# Patient Record
Sex: Female | Born: 1965 | Race: White | Hispanic: No | Marital: Married | Smoking: Never smoker
Health system: Southern US, Community
[De-identification: ages and names within clinical notes are randomized; demographics above are authoritative.]

## PROBLEM LIST (undated history)

## (undated) DIAGNOSIS — S4290XA Fracture of unspecified shoulder girdle, part unspecified, initial encounter for closed fracture: Secondary | ICD-10-CM

## (undated) DIAGNOSIS — J302 Other seasonal allergic rhinitis: Secondary | ICD-10-CM

## (undated) DIAGNOSIS — Z8669 Personal history of other diseases of the nervous system and sense organs: Secondary | ICD-10-CM

## (undated) DIAGNOSIS — I10 Essential (primary) hypertension: Secondary | ICD-10-CM

## (undated) DIAGNOSIS — N809 Endometriosis, unspecified: Secondary | ICD-10-CM

## (undated) HISTORY — DX: Personal history of other diseases of the nervous system and sense organs: Z86.69

## (undated) HISTORY — DX: Essential (primary) hypertension: I10

## (undated) HISTORY — PX: AUGMENTATION MAMMAPLASTY: SUR837

## (undated) HISTORY — DX: Endometriosis, unspecified: N80.9

## (undated) HISTORY — DX: Fracture of unspecified shoulder girdle, part unspecified, initial encounter for closed fracture: S42.90XA

## (undated) HISTORY — DX: Other seasonal allergic rhinitis: J30.2

---

## 1992-04-20 HISTORY — PX: LAPAROTOMY: SHX154

## 1995-04-21 HISTORY — PX: TONSILLECTOMY: SUR1361

## 1997-04-20 HISTORY — PX: TOTAL ABDOMINAL HYSTERECTOMY W/ BILATERAL SALPINGOOPHORECTOMY: SHX83

## 1997-04-20 HISTORY — PX: LAPAROSCOPY: SHX197

## 2004-06-18 HISTORY — PX: BREAST SURGERY: SHX581

## 2007-09-20 ENCOUNTER — Ambulatory Visit: Payer: Self-pay | Admitting: Obstetrics and Gynecology

## 2010-04-21 LAB — HM COLONOSCOPY

## 2010-07-31 LAB — BASIC METABOLIC PANEL
BUN: 13 mg/dL (ref 4–21)
Glucose: 80 mg/dL
Sodium: 138 mmol/L (ref 137–147)

## 2010-09-08 LAB — HM DEXA SCAN

## 2011-09-23 ENCOUNTER — Ambulatory Visit: Payer: Self-pay | Admitting: Obstetrics and Gynecology

## 2011-09-23 LAB — HM MAMMOGRAPHY

## 2012-07-06 ENCOUNTER — Other Ambulatory Visit: Payer: Self-pay | Admitting: *Deleted

## 2012-07-06 ENCOUNTER — Telehealth: Payer: Self-pay | Admitting: *Deleted

## 2012-07-06 MED ORDER — SPIRONOLACTONE 25 MG PO TABS: 25.0000 mg | ORAL_TABLET | Freq: Every day | ORAL | Status: DC

## 2012-07-06 NOTE — Telephone Encounter (Signed)
Patient notified of script that was filled and lab appointment scheduled.

## 2012-07-07 ENCOUNTER — Telehealth: Payer: Self-pay | Admitting: *Deleted

## 2012-07-07 NOTE — Telephone Encounter (Signed)
Called patient to see which pharmacy she wants her rx sent to?

## 2012-07-07 NOTE — Telephone Encounter (Signed)
Send to [facility]

## 2012-07-07 NOTE — Telephone Encounter (Signed)
Rx sent in to pharmacy. 

## 2012-07-15 ENCOUNTER — Telehealth: Payer: Self-pay | Admitting: *Deleted

## 2012-07-15 DIAGNOSIS — I10 Essential (primary) hypertension: Secondary | ICD-10-CM

## 2012-07-15 NOTE — Telephone Encounter (Signed)
I do not know this patient.

## 2012-07-15 NOTE — Telephone Encounter (Signed)
Pt is coming in for labs Monday 03.31.2014 what labs and dx would you like for this pt?  

## 2012-07-16 NOTE — Telephone Encounter (Signed)
Order placed for follow up labs 

## 2012-07-18 ENCOUNTER — Other Ambulatory Visit (INDEPENDENT_AMBULATORY_CARE_PROVIDER_SITE_OTHER): Payer: Managed Care, Other (non HMO)

## 2012-07-18 DIAGNOSIS — I10 Essential (primary) hypertension: Secondary | ICD-10-CM

## 2012-07-18 LAB — BASIC METABOLIC PANEL
BUN: 13 mg/dL (ref 6–23)
CO2: 27 mEq/L (ref 19–32)
Calcium: 9.7 mg/dL (ref 8.4–10.5)
Chloride: 98 mEq/L (ref 96–112)
Creatinine, Ser: 0.8 mg/dL (ref 0.4–1.2)
GFR: 81.01 mL/min (ref >60.00)
Glucose, Bld: 91 mg/dL (ref 70–99)
Potassium: 3.6 mEq/L (ref 3.5–5.1)
Sodium: 135 mEq/L (ref 135–145)

## 2012-07-19 ENCOUNTER — Encounter: Payer: Self-pay | Admitting: Internal Medicine

## 2012-09-16 ENCOUNTER — Encounter: Payer: Self-pay | Admitting: Internal Medicine

## 2012-09-16 ENCOUNTER — Ambulatory Visit (INDEPENDENT_AMBULATORY_CARE_PROVIDER_SITE_OTHER): Payer: Managed Care, Other (non HMO) | Admitting: Internal Medicine

## 2012-09-16 VITALS — BP 120/80 | HR 79 | Temp 98.5°F | Ht 64.0 in | Wt 177.5 lb

## 2012-09-16 DIAGNOSIS — I10 Essential (primary) hypertension: Secondary | ICD-10-CM

## 2012-09-16 DIAGNOSIS — N809 Endometriosis, unspecified: Secondary | ICD-10-CM

## 2012-09-16 DIAGNOSIS — R519 Headache, unspecified: Secondary | ICD-10-CM

## 2012-09-16 DIAGNOSIS — R51 Headache: Secondary | ICD-10-CM

## 2012-09-16 MED ORDER — SPIRONOLACTONE 25 MG PO TABS: 25.0000 mg | ORAL_TABLET | Freq: Every day | ORAL | Status: DC

## 2012-09-16 MED ORDER — AMLODIPINE BESYLATE 10 MG PO TABS: 10.0000 mg | ORAL_TABLET | Freq: Every day | ORAL | Status: DC

## 2012-09-16 NOTE — Patient Instructions (Addendum)
Saline nasal spray - flush nose at least 2-3x/day.  flonase - 2 sprays each nostril in the evening.  mucinex in the am and robitussin in the evening.  I am going to give you a prescription to have only if you need it.

## 2012-09-18 ENCOUNTER — Encounter: Payer: Self-pay | Admitting: Internal Medicine

## 2012-09-18 DIAGNOSIS — N809 Endometriosis, unspecified: Secondary | ICD-10-CM | POA: Insufficient documentation

## 2012-09-18 DIAGNOSIS — I10 Essential (primary) hypertension: Secondary | ICD-10-CM | POA: Insufficient documentation

## 2012-09-18 DIAGNOSIS — R519 Headache, unspecified: Secondary | ICD-10-CM | POA: Insufficient documentation

## 2012-09-18 NOTE — Assessment & Plan Note (Signed)
Followed by GYN.  Just evaluated.  Refer to Dr DeFrancesco's note for details.  Back on Femring now.  On progesterone.  Follow.    

## 2012-09-18 NOTE — Progress Notes (Signed)
Subjective:    Patient ID: Tracy Ward, female    DOB: 1966-01-15, 47 y.o.   MRN: [id number]  HPI 47 year old female with past history of hypertension, seasonal allergies, endometriosis and headaches.  She comes in today to follow up on these issues and to transfer her care here to [facility].  Former patient of mine at [employer].  States that starting approximately one week ago, she had sore throat.  Throat is better.  Now with head congestion.  Ears stopped up.  Some drainage.  No chest tightness or wheezing.  No fever, vomiting or diarrhea.  Some decreased appetite.  Some yellow mucus production.  Has been taking nyquil and dayquil.  No sob.  She recently saw Dr Greggory Keen.  Mora Appl back on [place].  States everything checked out fine.  States stress is decreasing.  Blood pressure has been doing well.     Past Medical History  Diagnosis Date  . Hypertension   . Seasonal allergies   . Endometriosis   . Hx of migraines     Outpatient Encounter Prescriptions as of 09/16/2012  Medication Sig Dispense Refill  . amLODipine (NORVASC) 10 MG tablet Take 1 tablet (10 mg total) by mouth daily.  90 tablet  1  . cetirizine (ZYRTEC) 10 MG tablet Take 10 mg by mouth daily.      . Estradiol Acetate (FEMRING) 0.05 MG/24HR RING Place vaginally. Insert rings into vagina every three months      . fluticasone (FLONASE) 50 MCG/ACT nasal spray Place 2 sprays into the nose daily.      . progesterone (PROMETRIUM) 100 MG capsule Take 1 1/2 capsules by mouth once a day      . rizatriptan (MAXALT) 10 MG tablet Take 10 mg by mouth as needed for migraine. May repeat in 2 hours if needed      . spironolactone (ALDACTONE) 25 MG tablet Take 1 tablet (25 mg total) by mouth daily.  90 tablet  1  . [DISCONTINUED] amLODipine (NORVASC) 10 MG tablet Take 10 mg by mouth daily.      . [DISCONTINUED] spironolactone (ALDACTONE) 25 MG tablet Take 1 tablet (25 mg total) by mouth daily.  90 tablet  0   No facility-administered  encounter medications on file as of 09/16/2012.    Review of Systems Patient denies any headache, lightheadedness or dizziness.  Sinus congestion and pressure as outlined.  Ears stopped up.  Yellow mucus production.  Increased drainage.  No chest tightness or wheezing.   No chest pain or palpitations.  No increased shortness of breath.  Does have cough.   No nausea or vomiting.  No acid reflux.  No abdominal pain or cramping.  No bowel change, such as diarrhea, constipation, BRBPR or melana.  No urine change.  Feels she is handling stress relatively well.  Is better.  States blood pressure has been doing well.  Last check 121/77.        Objective:   Physical Exam Filed Vitals:   09/16/12 1445  BP: 120/80  Pulse: 79  Temp: 98.5 F (36.9 C)   Blood pressure recheck:  118/80, pulse 24  47 year old female in no acute distress.   HEENT:  Nares- slightly erythematous turbinates.  Oropharynx - without lesions.  TMs visualized without erythema.  Minimal tenderness to palpation over the sinuses.   NECK:  Supple.  Nontender.  No audible bruit.  HEART:  Appears to be regular. LUNGS:  No crackles or wheezing audible.  Respirations  even and unlabored.  RADIAL PULSE:  Equal bilaterally.  ABDOMEN:  Soft, nontender.  Bowel sounds present and normal.  No audible abdominal bruit.  EXTREMITIES:  No increased edema present.  DP pulses palpable and equal bilaterally.          Assessment & Plan:  POSSIBLE SINUSITIS.  Symptoms and exam as outlined.  Will treat with mucinex in the am and robitussin in the evening.  Flonase nasal spray and saline nasal spray as directed.  Will given her a prescription for amoxicillin 875mg  to have only if symptoms progress and do not resolve.  Follow.    INCREASED PSYCHOSOCIAL STRESSORS.  Feels she is handling things relatively well.  Decreased stress.  Follow.    HEALTH MAINTENANCE.  Breasts, pelvic and pap smears through gyn.  Reported she did not want mammogram this year.   Will obtain records of last mammogram.

## 2012-09-18 NOTE — Assessment & Plan Note (Signed)
Did not report as an issue for her now.  Follow.   

## 2012-09-18 NOTE — Assessment & Plan Note (Signed)
Blood pressure controlled on current regimen.  Recent metabolic panel wnl.  Follow.

## 2012-10-06 ENCOUNTER — Encounter: Payer: Self-pay | Admitting: Internal Medicine

## 2012-10-25 ENCOUNTER — Other Ambulatory Visit: Payer: Self-pay | Admitting: Internal Medicine

## 2012-11-15 ENCOUNTER — Telehealth: Payer: Self-pay | Admitting: *Deleted

## 2012-11-15 NOTE — Telephone Encounter (Signed)
We are unable to prescribe abx now without seeing the pt.  Her last visit was in May.  She needs evaluation to assess what appropriate treatment warranted.

## 2012-11-15 NOTE — Telephone Encounter (Signed)
Pt states that she has a recurrent sinus infection. She was last seen on 09/16/12 & you had discussed giving her a Abx at that time. I informed pt that she needs to be seen before we could give her an Abx. She wants to know if you would consider calling or sending her in a Abx without her being seen.

## 2012-11-15 NOTE — Telephone Encounter (Signed)
Pt notified & wanted to bo seen today. Informed pt that we have no openings until tomorrow & that she should go to Urgent Care today if she feels that she can't wait until then. Pt verbalized understanding & states that she will probably go to UC today.

## 2013-01-26 ENCOUNTER — Encounter: Payer: Self-pay | Admitting: Cardiovascular Disease

## 2013-03-17 ENCOUNTER — Other Ambulatory Visit: Payer: Self-pay | Admitting: Internal Medicine

## 2013-03-20 ENCOUNTER — Ambulatory Visit: Payer: Managed Care, Other (non HMO) | Admitting: Internal Medicine

## 2013-05-22 ENCOUNTER — Encounter (INDEPENDENT_AMBULATORY_CARE_PROVIDER_SITE_OTHER): Payer: Self-pay

## 2013-05-22 ENCOUNTER — Encounter: Payer: Self-pay | Admitting: Internal Medicine

## 2013-05-22 ENCOUNTER — Ambulatory Visit (INDEPENDENT_AMBULATORY_CARE_PROVIDER_SITE_OTHER): Payer: BC Managed Care – PPO | Admitting: Internal Medicine

## 2013-05-22 VITALS — BP 130/100 | HR 70 | Temp 98.5°F | Ht 64.0 in | Wt 180.5 lb

## 2013-05-22 DIAGNOSIS — R51 Headache: Secondary | ICD-10-CM

## 2013-05-22 DIAGNOSIS — I10 Essential (primary) hypertension: Secondary | ICD-10-CM

## 2013-05-22 DIAGNOSIS — R519 Headache, unspecified: Secondary | ICD-10-CM

## 2013-05-22 DIAGNOSIS — N809 Endometriosis, unspecified: Secondary | ICD-10-CM

## 2013-05-22 MED ORDER — FLUTICASONE PROPIONATE 50 MCG/ACT NA SUSP: 2.0000 | Freq: Every day | NASAL | Status: DC

## 2013-05-22 MED ORDER — SPIRONOLACTONE 25 MG PO TABS: 25.0000 mg | ORAL_TABLET | Freq: Every day | ORAL | Status: DC

## 2013-05-22 MED ORDER — AMLODIPINE BESYLATE 10 MG PO TABS: 10.0000 mg | ORAL_TABLET | Freq: Every day | ORAL | Status: DC

## 2013-05-22 NOTE — Patient Instructions (Signed)
Bilateral screening mammogram.    Tetanus booster or Tdap

## 2013-05-22 NOTE — Progress Notes (Signed)
Pre-visit discussion using our clinic review tool. No additional management support is needed unless otherwise documented below in the visit note.  

## 2013-05-28 ENCOUNTER — Encounter: Payer: Self-pay | Admitting: Internal Medicine

## 2013-05-28 MED ORDER — SPIRONOLACTONE 25 MG PO TABS: 25.0000 mg | ORAL_TABLET | Freq: Every day | ORAL | Status: DC

## 2013-05-28 MED ORDER — AMLODIPINE BESYLATE 10 MG PO TABS: 10.0000 mg | ORAL_TABLET | Freq: Every day | ORAL | Status: DC

## 2013-05-28 MED ORDER — FLUTICASONE PROPIONATE 50 MCG/ACT NA SUSP: 2.0000 | Freq: Every day | NASAL | Status: DC

## 2013-05-28 NOTE — Progress Notes (Signed)
Subjective:    Patient ID: Tracy Ward, female    DOB: 21-Feb-1966, 48 y.o.   MRN: [id number]  HPI 48 year old female with past history of hypertension, seasonal allergies, endometriosis and headaches.  She comes in today for a scheduled follow up.   States she is doing well.  Has been out of her medication for approximately one month.  This was secondary to insurance change.  Seeing Dr Greggory KeeneFrancesco.  Up to date with her gyn exams.  Due f/u 5-6/15.  Handling stress relatively well.  No cardiac symptoms with increased activity or exertion.  No sob.  Bowels stable.     Past Medical History  Diagnosis Date  . Hypertension   . Seasonal allergies   . Endometriosis   . Hx of migraines     Outpatient Encounter Prescriptions as of 05/22/2013  Medication Sig  . amLODipine (NORVASC) 10 MG tablet Take 1 tablet (10 mg total) by mouth daily.  . cetirizine (ZYRTEC) 10 MG tablet Take 10 mg by mouth daily.  . Estradiol Acetate (FEMRING) 0.05 MG/24HR RING Place vaginally. Insert rings into vagina every three months  . fluticasone (FLONASE) 50 MCG/ACT nasal spray Place 2 sprays into both nostrils daily.  . progesterone (PROMETRIUM) 100 MG capsule Take 1 1/2 capsules by mouth once a day  . rizatriptan (MAXALT) 10 MG tablet Take 10 mg by mouth as needed for migraine. May repeat in 2 hours if needed  . spironolactone (ALDACTONE) 25 MG tablet Take 1 tablet (25 mg total) by mouth daily.  . [DISCONTINUED] amLODipine (NORVASC) 10 MG tablet TAKE 1 TABLET BY MOUTH DAILY.  . [DISCONTINUED] fluticasone (FLONASE) 50 MCG/ACT nasal spray SPRAY 2 SPRAY INTO BOTH NOSTRILS ONCE A DAY AS NEEDED FOR ALLERGY SYMPTOMS  . [DISCONTINUED] spironolactone (ALDACTONE) 25 MG tablet Take 1 tablet (25 mg total) by mouth daily.    Review of Systems Patient denies any headache, lightheadedness or dizziness.  Sinus congestion and pressure as outlined.  Ears stopped up.  Yellow mucus production.  Increased drainage.  No chest tightness or  wheezing.   No chest pain or palpitations.  No increased shortness of breath.  Does have cough.   No nausea or vomiting.  No acid reflux.  No abdominal pain or cramping.  No bowel change, such as diarrhea, constipation, BRBPR or melana.  No urine change.  Feels she is handling stress relatively well.  Is better.  States blood pressure has been doing well.  Last check 121/77.        Objective:   Physical Exam  Filed Vitals:   05/22/13 1507  BP: 130/100  Pulse: 70  Temp: 98.5 F (36.9 C)   Blood pressure recheck:  39134/[id number]  48 year old female in no acute distress.   HEENT:  Nares- clear.  Oropharynx - without lesions.    NECK:  Supple.  Nontender.  No audible bruit.  HEART:  Appears to be regular. LUNGS:  No crackles or wheezing audible.  Respirations even and unlabored.  RADIAL PULSE:  Equal bilaterally.  ABDOMEN:  Soft, nontender.  Bowel sounds present and normal.  No audible abdominal bruit.  EXTREMITIES:  No increased edema present.  DP pulses palpable and equal bilaterally.          Assessment & Plan:  INCREASED PSYCHOSOCIAL STRESSORS.  Feels she is handling things relatively well.  Decreased stress.  Follow.    HEALTH MAINTENANCE.  Breasts, pelvic and pap smears through gyn.  Reported she did not want  mammogram last year.  Information given to schedule mammogram.

## 2013-05-28 NOTE — Assessment & Plan Note (Signed)
Did not report as an issue for her now.  Follow.

## 2013-05-28 NOTE — Assessment & Plan Note (Signed)
Followed by GYN.  Just evaluated.  Refer to Dr DeFrancesco's note for details.  Back on Femring now.  On progesterone.  Follow.

## 2013-05-28 NOTE — Assessment & Plan Note (Addendum)
Blood pressure had been under control.  Out of her medication recently.  Blood pressure elevated.  Restart.   Recheck metabolic panel.

## 2013-05-31 ENCOUNTER — Telehealth: Payer: Self-pay | Admitting: *Deleted

## 2013-05-31 DIAGNOSIS — I10 Essential (primary) hypertension: Secondary | ICD-10-CM

## 2013-05-31 DIAGNOSIS — N809 Endometriosis, unspecified: Secondary | ICD-10-CM

## 2013-05-31 DIAGNOSIS — Z13220 Encounter for screening for lipoid disorders: Secondary | ICD-10-CM

## 2013-05-31 NOTE — Telephone Encounter (Signed)
Pt is coming in tomorrow what labs and dx?  

## 2013-05-31 NOTE — Telephone Encounter (Signed)
Orders placed for labs

## 2013-06-01 ENCOUNTER — Other Ambulatory Visit: Payer: BC Managed Care – PPO

## 2013-07-21 ENCOUNTER — Ambulatory Visit: Payer: BC Managed Care – PPO | Admitting: Internal Medicine

## 2013-08-11 ENCOUNTER — Ambulatory Visit: Payer: BC Managed Care – PPO | Admitting: Internal Medicine

## 2013-12-04 ENCOUNTER — Encounter: Payer: Self-pay | Admitting: Internal Medicine

## 2014-04-27 ENCOUNTER — Other Ambulatory Visit: Payer: Self-pay | Admitting: Internal Medicine

## 2014-06-14 ENCOUNTER — Ambulatory Visit (INDEPENDENT_AMBULATORY_CARE_PROVIDER_SITE_OTHER): Payer: BLUE CROSS/BLUE SHIELD | Admitting: Internal Medicine

## 2014-06-14 ENCOUNTER — Encounter: Payer: Self-pay | Admitting: Internal Medicine

## 2014-06-14 VITALS — BP 130/86 | HR 64 | Temp 98.1°F | Ht 64.0 in | Wt 188.4 lb

## 2014-06-14 DIAGNOSIS — Z13220 Encounter for screening for lipoid disorders: Secondary | ICD-10-CM

## 2014-06-14 DIAGNOSIS — Z0000 Encounter for general adult medical examination without abnormal findings: Secondary | ICD-10-CM

## 2014-06-14 DIAGNOSIS — Z8669 Personal history of other diseases of the nervous system and sense organs: Secondary | ICD-10-CM

## 2014-06-14 DIAGNOSIS — M545 Low back pain: Secondary | ICD-10-CM

## 2014-06-14 DIAGNOSIS — R51 Headache: Secondary | ICD-10-CM

## 2014-06-14 DIAGNOSIS — I10 Essential (primary) hypertension: Secondary | ICD-10-CM

## 2014-06-14 DIAGNOSIS — R519 Headache, unspecified: Secondary | ICD-10-CM

## 2014-06-14 DIAGNOSIS — N809 Endometriosis, unspecified: Secondary | ICD-10-CM

## 2014-06-14 LAB — CBC WITH DIFFERENTIAL/PLATELET
Basophils Absolute: 0 10*3/uL (ref 0.0–0.1)
Basophils Relative: 0.4 % (ref 0.0–3.0)
Eosinophils Absolute: 0.3 10*3/uL (ref 0.0–0.7)
Eosinophils Relative: 4.6 % (ref 0.0–5.0)
HCT: 43.7 % (ref 36.0–46.0)
Hemoglobin: 15 g/dL (ref 12.0–15.0)
Lymphocytes Relative: 33.6 % (ref 12.0–46.0)
Lymphs Abs: 2.3 10*3/uL (ref 0.7–4.0)
MCHC: 34.4 g/dL (ref 30.0–36.0)
MCV: 87.2 fl (ref 78.0–100.0)
Monocytes Absolute: 0.6 10*3/uL (ref 0.1–1.0)
Monocytes Relative: 8.9 % (ref 3.0–12.0)
Neutro Abs: 3.6 10*3/uL (ref 1.4–7.7)
Neutrophils Relative %: 52.5 % (ref 43.0–77.0)
Platelets: 302 10*3/uL (ref 150.0–400.0)
RBC: 5.01 Mil/uL (ref 3.87–5.11)
RDW: 12.9 % (ref 11.5–15.5)
WBC: 6.8 10*3/uL (ref 4.0–10.5)

## 2014-06-14 LAB — COMPREHENSIVE METABOLIC PANEL
ALT: 22 U/L (ref 0–35)
AST: 24 U/L (ref 0–37)
Albumin: 4.4 g/dL (ref 3.5–5.2)
Alkaline Phosphatase: 78 U/L (ref 39–117)
BUN: 13 mg/dL (ref 6–23)
CO2: 24 mEq/L (ref 19–32)
Calcium: 9.9 mg/dL (ref 8.4–10.5)
Chloride: 101 mEq/L (ref 96–112)
Creatinine, Ser: 1 mg/dL (ref 0.40–1.20)
GFR: 63 mL/min (ref >60.00)
Glucose, Bld: 94 mg/dL (ref 70–99)
Potassium: 3.8 mEq/L (ref 3.5–5.1)
Sodium: 137 mEq/L (ref 135–145)
Total Bilirubin: 0.3 mg/dL (ref 0.2–1.2)
Total Protein: 7.6 g/dL (ref 6.0–8.3)

## 2014-06-14 LAB — LIPID PANEL
Cholesterol: 198 mg/dL (ref 0–200)
HDL: 49.1 mg/dL (ref >39.00)
LDL Cholesterol: 114 mg/dL — ABNORMAL HIGH (ref 0–99)
NonHDL: 148.9
Total CHOL/HDL Ratio: 4
Triglycerides: 176 mg/dL — ABNORMAL HIGH (ref 0.0–149.0)
VLDL: 35.2 mg/dL (ref 0.0–40.0)

## 2014-06-14 LAB — TSH: TSH: 6.05 u[IU]/mL — ABNORMAL HIGH (ref 0.35–4.50)

## 2014-06-14 MED ORDER — SPIRONOLACTONE 25 MG PO TABS: ORAL_TABLET | ORAL | Status: DC

## 2014-06-14 MED ORDER — TRAMADOL HCL 50 MG PO TABS: 50.0000 mg | ORAL_TABLET | Freq: Two times a day (BID) | ORAL | Status: DC | PRN

## 2014-06-14 MED ORDER — AMLODIPINE BESYLATE 10 MG PO TABS: ORAL_TABLET | ORAL | Status: DC

## 2014-06-14 NOTE — Progress Notes (Signed)
Patient ID: Tracy Ward, female   DOB: 12-10-1965, 49 y.o.   MRN: [id number]   Subjective:    Patient ID: Tracy Ward, female    DOB: 04-04-1966, 49 y.o.   MRN: [id number]  HPI  Patient here for a scheduled follow up.  Has a history of hypertension and endometriosis.  States her blood pressure has been doing well.  Blood pressures have been averaging 125/82.  States up to date with gyn exams.  Has f/u with Dr Greggory Keen in 08/2014.  Gets her mammograms through [facility].  Does report the need to have something intermittently for low back pain.  States injured her back initially years ago.  Will intermittently flare.  No numbness or tingling in her legs.  Pain mostly localized to lower back.  Takes ibuprofen occasionally.  Tylenol does not help.  Has taken tramadol previously and tolerated.  Just needs something to take prn.  Would not take often.  She also reports her eyes have been itching and red.  Worse at times.  Some swelling.  Has tried an oral antihistamine and antihistamine eye drops.  States these did not help.  No cardiac symptoms with increased activity or exertion.  Breathing stable.     Past Medical History  Diagnosis Date  . Hypertension   . Seasonal allergies   . Endometriosis   . Hx of migraines     Current Outpatient Prescriptions on File Prior to Visit  Medication Sig Dispense Refill  . cetirizine (ZYRTEC) 10 MG tablet Take 10 mg by mouth daily.    . Estradiol Acetate (FEMRING) 0.05 MG/24HR RING Place vaginally. Insert rings into vagina every three months    . fluticasone (FLONASE) 50 MCG/ACT nasal spray Place 2 sprays into both nostrils daily. 48 g 1  . progesterone (PROMETRIUM) 100 MG capsule Take 1 1/2 capsules by mouth once a day     No current facility-administered medications on file prior to visit.    Review of Systems  Constitutional: Negative for appetite change and unexpected weight change.  HENT: Negative for congestion and sinus pressure.   Eyes:  Positive for redness and itching. Negative for visual disturbance.       Some swelling around her eye.    Respiratory: Negative for cough, chest tightness and shortness of breath.   Cardiovascular: Negative for chest pain, palpitations and leg swelling.  Gastrointestinal: Negative for nausea, vomiting, abdominal pain and diarrhea.  Genitourinary: Negative for frequency and difficulty urinating.  Musculoskeletal: Positive for back pain. Negative for joint swelling.  Neurological: Negative for dizziness, light-headedness and headaches.  Hematological: Negative for adenopathy. Does not bruise/bleed easily.       Objective:    Physical Exam  Constitutional: She appears well-developed and well-nourished. No distress.  HENT:  Nose: Nose normal.  Mouth/Throat: Oropharynx is clear and moist.  Eyes: Pupils are equal, round, and reactive to light. Right eye exhibits no discharge. Left eye exhibits no discharge.  Some swelling - around the eye.  Minimal redness.   Neck: Neck supple. No thyromegaly present.  Cardiovascular: Normal rate and regular rhythm.   Pulmonary/Chest: Breath sounds normal. No respiratory distress. She has no wheezes.  Abdominal: Soft. Bowel sounds are normal. There is no tenderness.  Musculoskeletal: She exhibits no edema or tenderness.  Lymphadenopathy:    She has no cervical adenopathy.  Skin: No rash noted. No erythema.    BP 130/86 mmHg  Pulse 64  Temp(Src) 98.1 F (36.7 C) (Oral)  Ht  (  1.626 m)  Wt 188 lb 6 oz (85.446 kg)  BMI 32.32 kg/m2  SpO2 94% Wt Readings from Last 3 Encounters:  06/14/14 188 lb 6 oz (85.446 kg)  05/22/13 180 lb 8 oz (81.874 kg)  09/16/12 177 lb 8 oz (80.513 kg)     Lab Results  Component Value Date   WBC 6.8 06/14/2014   HGB 15.0 06/14/2014   HCT 43.7 06/14/2014   PLT 302.0 06/14/2014   GLUCOSE 94 06/14/2014   CHOL 198 06/14/2014   TRIG 176.0* 06/14/2014   HDL 49.10 06/14/2014   LDLCALC 114* 06/14/2014   ALT 22  06/14/2014   AST 24 06/14/2014   NA 137 06/14/2014   K 3.8 06/14/2014   CL 101 06/14/2014   CREATININE 1.00 06/14/2014   BUN 13 06/14/2014   CO2 24 06/14/2014   TSH 6.05* 06/14/2014       Assessment & Plan:   Problem List Items Addressed This Visit    Back pain    Low back pain as outlined.  Intermittent flares.  No radicular symptoms.  Takes antiinflammatory medications prn.  Request to have something else to take prn.  rx given for tramadol.  Has taken previously with no rash or reaction.  Tolerated well.  Follow.  Desires no further w/up at this time.  Follow.        Relevant Medications   traMADol (ULTRAM) tablet 50 mg   Endometriosis    Sees Dr Greggory KeeneFrancesco.  States up to date and due f/u in 08/2014.  Obtain latest records.        Relevant Orders   CBC with Differential/Platelet (Completed)   TSH (Completed)   Headache    Did not report as an issue now.  Follow.       Relevant Medications   amLODIpine (NORVASC) tablet   traMADol (ULTRAM) tablet 50 mg   Health care maintenance    Gets her gyn exam with Dr Greggory KeeneFrancesco.  Up to date.  Obtain records.       History of itching of eye    Persistent intermittent flares.  Has tried antihistamine eye drops and oral antihistamines without relief.  Recommend referral to her eye doctor.  She will call.  Follow.         Hypertension - Primary    Blood pressure recheck 122/84 (today).  Continue same medication regimen.  Check metabolic panel.        Relevant Medications   amLODIpine (NORVASC) tablet   spironolactone (ALDACTONE) tablet   Other Relevant Orders   Comprehensive metabolic panel (Completed)    Other Visit Diagnoses    Screening cholesterol level        Relevant Orders    Lipid panel (Completed)      I spent 25 minutes with the patient and more than 50% of the time was spent in consultation regarding the above.     Dale DurhamSCOTT, Jaideep Pollack, MD

## 2014-06-14 NOTE — Progress Notes (Signed)
Pre visit review using our clinic review tool, if applicable. No additional management support is needed unless otherwise documented below in the visit note. 

## 2014-06-15 ENCOUNTER — Other Ambulatory Visit: Payer: Self-pay | Admitting: Internal Medicine

## 2014-06-15 ENCOUNTER — Encounter: Payer: Self-pay | Admitting: *Deleted

## 2014-06-15 DIAGNOSIS — R7989 Other specified abnormal findings of blood chemistry: Secondary | ICD-10-CM

## 2014-06-15 NOTE — Progress Notes (Signed)
Order placed for f/u tsh.  

## 2014-06-17 ENCOUNTER — Telehealth: Payer: Self-pay | Admitting: Internal Medicine

## 2014-06-17 ENCOUNTER — Encounter: Payer: Self-pay | Admitting: Internal Medicine

## 2014-06-17 DIAGNOSIS — Z8669 Personal history of other diseases of the nervous system and sense organs: Secondary | ICD-10-CM | POA: Insufficient documentation

## 2014-06-17 DIAGNOSIS — Z0000 Encounter for general adult medical examination without abnormal findings: Secondary | ICD-10-CM | POA: Insufficient documentation

## 2014-06-17 DIAGNOSIS — M549 Dorsalgia, unspecified: Secondary | ICD-10-CM | POA: Insufficient documentation

## 2014-06-17 NOTE — Assessment & Plan Note (Signed)
Blood pressure recheck 122/84 (today).  Continue same medication regimen.  Check metabolic panel.

## 2014-06-17 NOTE — Assessment & Plan Note (Signed)
Persistent intermittent flares.  Has tried antihistamine eye drops and oral antihistamines without relief.  Recommend referral to her eye doctor.  She will call.  Follow.

## 2014-06-17 NOTE — Assessment & Plan Note (Signed)
Did not report as an issue now.  Follow.

## 2014-06-17 NOTE — Assessment & Plan Note (Signed)
Sees Dr Greggory KeeneFrancesco.  States up to date and due f/u in 08/2014.  Obtain latest records.

## 2014-06-17 NOTE — Assessment & Plan Note (Signed)
Low back pain as outlined.  Intermittent flares.  No radicular symptoms.  Takes antiinflammatory medications prn.  Request to have something else to take prn.  rx given for tramadol.  Has taken previously with no rash or reaction.  Tolerated well.  Follow.  Desires no further w/up at this time.  Follow.

## 2014-06-17 NOTE — Telephone Encounter (Signed)
Need last couple of office visits and last pap and mammogram - from Dr DeFrancesco's office.

## 2014-06-17 NOTE — Assessment & Plan Note (Signed)
Gets her gyn exam with Dr Greggory KeeneFrancesco.  Up to date.  Obtain records.

## 2014-06-18 ENCOUNTER — Encounter: Payer: Self-pay | Admitting: Internal Medicine

## 2014-06-18 ENCOUNTER — Encounter: Payer: Self-pay | Admitting: *Deleted

## 2014-06-18 NOTE — Telephone Encounter (Signed)
See notes in green folder (Pap was not done in last 2 visits, last mammogram was in 2013-report requested)

## 2014-06-18 NOTE — Telephone Encounter (Signed)
Letter mailed

## 2014-06-18 NOTE — Telephone Encounter (Signed)
Mammogram abstracted 

## 2014-06-18 NOTE — Telephone Encounter (Signed)
Notify pt that she needs mammogram.  Last 2013.  If wants us to schedule we can.  Just let us know.

## 2014-06-18 NOTE — Telephone Encounter (Signed)
Records requested

## 2014-07-03 ENCOUNTER — Telehealth: Payer: Self-pay

## 2014-07-03 NOTE — Telephone Encounter (Signed)
Duplicate

## 2014-07-03 NOTE — Telephone Encounter (Signed)
Pt.notified

## 2014-07-03 NOTE — Telephone Encounter (Signed)
The patient is hoping to get a muscle relaxer called in for her back. She states she is having ongoing discomfort.  [facility] [address]  Pt callback [phone]- [phone]

## 2014-07-03 NOTE — Telephone Encounter (Signed)
I had given her tramadol.  If she is having continued pain and needing something more, needs evaluation.

## 2014-07-11 ENCOUNTER — Telehealth: Payer: Self-pay | Admitting: Internal Medicine

## 2014-07-11 DIAGNOSIS — R7989 Other specified abnormal findings of blood chemistry: Secondary | ICD-10-CM

## 2014-07-11 NOTE — Telephone Encounter (Signed)
The patient wants to have a full thyroid panel done. She has a TSH scheduled on 3.28.16.

## 2014-07-14 NOTE — Telephone Encounter (Signed)
Full thyroid panel ordered

## 2014-07-16 ENCOUNTER — Other Ambulatory Visit: Payer: BLUE CROSS/BLUE SHIELD

## 2014-07-16 ENCOUNTER — Other Ambulatory Visit (INDEPENDENT_AMBULATORY_CARE_PROVIDER_SITE_OTHER): Payer: BLUE CROSS/BLUE SHIELD

## 2014-07-16 DIAGNOSIS — R946 Abnormal results of thyroid function studies: Secondary | ICD-10-CM | POA: Diagnosis not present

## 2014-07-16 DIAGNOSIS — R7989 Other specified abnormal findings of blood chemistry: Secondary | ICD-10-CM

## 2014-07-16 LAB — TSH: TSH: 3.42 u[IU]/mL (ref 0.35–4.50)

## 2014-07-16 LAB — T4, FREE: Free T4: 0.95 ng/dL (ref 0.60–1.60)

## 2014-07-16 LAB — T3, FREE: T3, Free: 3.3 pg/mL (ref 2.3–4.2)

## 2014-07-17 ENCOUNTER — Encounter: Payer: Self-pay | Admitting: *Deleted

## 2014-07-19 ENCOUNTER — Encounter: Payer: Self-pay | Admitting: Internal Medicine

## 2014-07-21 DIAGNOSIS — R638 Other symptoms and signs concerning food and fluid intake: Secondary | ICD-10-CM | POA: Insufficient documentation

## 2014-07-21 DIAGNOSIS — N898 Other specified noninflammatory disorders of vagina: Secondary | ICD-10-CM | POA: Insufficient documentation

## 2014-07-21 DIAGNOSIS — E8940 Asymptomatic postprocedural ovarian failure: Secondary | ICD-10-CM | POA: Insufficient documentation

## 2014-07-21 DIAGNOSIS — R6882 Decreased libido: Secondary | ICD-10-CM | POA: Insufficient documentation

## 2014-07-21 DIAGNOSIS — G8929 Other chronic pain: Secondary | ICD-10-CM | POA: Insufficient documentation

## 2014-07-21 DIAGNOSIS — E039 Hypothyroidism, unspecified: Secondary | ICD-10-CM | POA: Insufficient documentation

## 2014-07-21 DIAGNOSIS — IMO0002 Reserved for concepts with insufficient information to code with codable children: Secondary | ICD-10-CM | POA: Insufficient documentation

## 2014-07-21 DIAGNOSIS — M8580 Other specified disorders of bone density and structure, unspecified site: Secondary | ICD-10-CM | POA: Insufficient documentation

## 2014-07-21 DIAGNOSIS — R102 Pelvic and perineal pain: Secondary | ICD-10-CM

## 2014-07-21 DIAGNOSIS — Z8669 Personal history of other diseases of the nervous system and sense organs: Secondary | ICD-10-CM | POA: Insufficient documentation

## 2014-07-21 DIAGNOSIS — J302 Other seasonal allergic rhinitis: Secondary | ICD-10-CM | POA: Insufficient documentation

## 2014-09-19 ENCOUNTER — Encounter: Payer: Self-pay | Admitting: Obstetrics and Gynecology

## 2014-09-19 ENCOUNTER — Ambulatory Visit (INDEPENDENT_AMBULATORY_CARE_PROVIDER_SITE_OTHER): Payer: BLUE CROSS/BLUE SHIELD | Admitting: Obstetrics and Gynecology

## 2014-09-19 VITALS — BP 130/82 | HR 84 | Ht 64.0 in | Wt 188.1 lb

## 2014-09-19 DIAGNOSIS — N958 Other specified menopausal and perimenopausal disorders: Secondary | ICD-10-CM | POA: Diagnosis not present

## 2014-09-19 DIAGNOSIS — N809 Endometriosis, unspecified: Secondary | ICD-10-CM | POA: Diagnosis not present

## 2014-09-19 DIAGNOSIS — E8940 Asymptomatic postprocedural ovarian failure: Secondary | ICD-10-CM

## 2014-09-19 MED ORDER — PROGESTERONE MICRONIZED 200 MG PO CAPS: 200.0000 mg | ORAL_CAPSULE | Freq: Every day | ORAL | Status: DC

## 2014-09-19 NOTE — Progress Notes (Signed)
Patient ID: Tracy Ward, female   DOB: 04/25/1965, 49 y.o.   MRN: [id number]   GYN ANNUAL PREVENTATIVE CARE ENCOUNTER NOTE  Subjective:       Tracy Ward is a 49 y.o. No obstetric history on file. female here for a routine annual gynecologic exam.  Current complaints: see other note..     Gynecologic History No LMP recorded. Patient has had a hysterectomy. Contraception: status post hysterectomy Last Pap: not needed. Results were: normal Last mammogram: did not go last year- needs one. Results were:   Obstetric History OB History  No data available    Past Medical History  Diagnosis Date  . Hypertension   . Seasonal allergies   . Endometriosis   . Hx of migraines     Past Surgical History  Procedure Laterality Date  . Laparotomy  1994    Endometriosis  . Laparoscopy  1999    Endometriosis  . Tonsillectomy  1997    Partial  . Breast surgery  06/2004    augmentation  . Total abdominal hysterectomy w/ bilateral salpingoophorectomy  1999    Current Outpatient Prescriptions on File Prior to Visit  Medication Sig Dispense Refill  . amLODipine (NORVASC) 10 MG tablet TAKE 1 TABLET (10 MG TOTAL) DAILY 90 tablet 1  . cetirizine (ZYRTEC) 10 MG tablet Take 10 mg by mouth daily.    . fluticasone (FLONASE) 50 MCG/ACT nasal spray Place 2 sprays into both nostrils daily. 48 g 1  . progesterone (PROMETRIUM) 100 MG capsule Take 1 1/2 capsules by mouth once a day    . spironolactone (ALDACTONE) 25 MG tablet TAKE 1 TABLET (25 MG TOTAL) DAILY 90 tablet 1  . azelastine (OPTIVAR) 0.05 % ophthalmic solution 1 drop 2 (two) times daily.  0  . Cetirizine HCl 10 MG CAPS Take 10 mg by mouth as needed.    . Estradiol Acetate (FEMRING) 0.05 MG/24HR RING Place vaginally. Insert rings into vagina every three months    . fluorometholone (FML) 0.1 % ophthalmic suspension Place 1 drop into both eyes 4 (four) times daily.  0  . traMADol (ULTRAM) 50 MG tablet Take 1 tablet (50 mg total) by mouth  every 12 (twelve) hours as needed. (Patient not taking: Reported on 09/19/2014) 30 tablet 0   No current facility-administered medications on file prior to visit.    Allergies  Allergen Reactions  . Celexa [Citalopram]   . Codeine Sulfate Itching  . Imitrex [Sumatriptan]   . Lisinopril Nausea And Vomiting  . Percocet [Oxycodone-Acetaminophen]     History   Social History  . Marital Status: Married    Spouse Name: N/A  . Number of Children: 0  . Years of Education: N/A   Occupational History  .     Social History Main Topics  . Smoking status: Never Smoker   . Smokeless tobacco: Never Used  . Alcohol Use: 0.0 oz/week    0 Standard drinks or equivalent per week  . Drug Use: No  . Sexual Activity: Not on file   Other Topics Concern  . Not on file   Social History Narrative    Family History  Problem Relation Age of Onset  . Arthritis Mother   . Hypertension Father   . Alcohol abuse Father   . Stroke Father   . Arthritis Maternal Grandmother   . Heart disease Maternal Grandfather   . Hypertension Maternal Grandfather   . Cancer Neg Hx   . Colon cancer Neg Hx   .  Ovarian cancer Neg Hx   . Breast cancer Neg Hx     The following portions of the patient's history were reviewed and updated as appropriate: allergies, current medications, past family history, past medical history, past social history, past surgical history and problem list.  Review of Systems ROS   Objective:   BP 130/82 mmHg  Pulse 84  Ht  (1.626 m)  Wt 188 lb 1.6 oz (85.322 kg)  BMI 32.27 kg/m2 CONSTITUTIONAL: Well-developed, well-nourished female in no acute distress.  HENT:  Normocephalic, atraumatic, External right and left ear normal. Oropharynx is clear and moist EYES: Conjunctivae and EOM are normal. Pupils are equal, round, and reactive to light. No scleral icterus.  NECK: Normal range of motion, supple, no masses.  Normal thyroid.  SKIN: Skin is warm and dry. No rash noted. Not  diaphoretic. No erythema. No pallor. NEUROLGIC: Alert and oriented to person, place, and time. Normal reflexes, muscle tone coordination. No cranial nerve deficit noted. PSYCHIATRIC: Normal mood and affect. Normal behavior. Normal judgment and thought content. CARDIOVASCULAR: Normal heart rate noted, regular rhythm RESPIRATORY: Clear to auscultation bilaterally. Effort and breath sounds normal, no problems with respiration noted. BREASTS: Symmetric in size. No masses, skin changes, nipple drainage, or lymphadenopathy. ABDOMEN: Soft, normal bowel sounds, no distention noted.  No tenderness, rebound or guarding.  PELVIC: Normal appearing external genitalia; normal appearing vaginal mucosa and cervix.  Normal appearing discharge.  Pap smear obtained.  Normal uterine size, no other palpable masses, no uterine or adnexal tenderness. MUSCULOSKELETAL: Normal range of motion. No tenderness.  No cyanosis, clubbing, or edema.  2+ distal pulses.   Assessment:   Annual gynecologic examination 49 y.o. Contraception: status post hysterectomy Normal BMI, Overweight, Obesity 1 and Obesity 2 Patient Active Problem List   Diagnosis Date Noted  . Chronic female pelvic pain 07/21/2014  . Decreased libido 07/21/2014  . Coitalgia 07/21/2014  . History of migraine headaches 07/21/2014  . Adult hypothyroidism 07/21/2014  . Increased BMI 07/21/2014  . Osteopenia 07/21/2014  . Allergic rhinitis, seasonal 07/21/2014  . Postsurgical menopause 07/21/2014  . Dryness of vagina 07/21/2014  . Back pain 06/17/2014  . History of itching of eye 06/17/2014  . Health care maintenance 06/17/2014  . Hypertension 09/18/2012  . Endometriosis 09/18/2012  . Headache 09/18/2012     Plan:  Pap: Pap, Reflex if ASCUS, Pap Co Test, GC/CT NAAT, Not needed and Not done Mammogram: ordered, Ordered, Not Ordered and Not Indicated Labs: N/A Routine preventative health maintenance measures emphasized: Return to Clinic - 1  [address] [place], [place]

## 2014-09-19 NOTE — Progress Notes (Signed)
GYN ANNUAL PREVENTATIVE CARE ENCOUNTER NOTE  Subjective:       Tracy Ward is a 49 y.o. No obstetric history on file. female here for a routine annual gynecologic exam.  Current complaints: Patient reports increased discomfort with intercourse.  Symptoms have persisted over the past year and possibly worsened.  She is using the hemorrhaging intravaginal along with micronized progesterone 100 mg at night.  She has concerns about the vaginal absorption of estrogen possibly exacerbating her endometriosis symptoms.   Gynecologic History No LMP recorded. Patient has had a hysterectomy. Contraception: status post hysterectomy Last Pap: 2009. Results were: normal Last mammogram: 2013. Results were: normal  Obstetric History OB History  No data available    Past Medical History  Diagnosis Date  . Hypertension   . Seasonal allergies   . Endometriosis   . Hx of migraines     Past Surgical History  Procedure Laterality Date  . Laparotomy  1994    Endometriosis  . Laparoscopy  1999    Endometriosis  . Tonsillectomy  1997    Partial  . Breast surgery  06/2004    augmentation  . Total abdominal hysterectomy w/ bilateral salpingoophorectomy  1999    Current Outpatient Prescriptions on File Prior to Visit  Medication Sig Dispense Refill  . amLODipine (NORVASC) 10 MG tablet TAKE 1 TABLET (10 MG TOTAL) DAILY 90 tablet 1  . cetirizine (ZYRTEC) 10 MG tablet Take 10 mg by mouth daily.    . fluticasone (FLONASE) 50 MCG/ACT nasal spray Place 2 sprays into both nostrils daily. 48 g 1  . spironolactone (ALDACTONE) 25 MG tablet TAKE 1 TABLET (25 MG TOTAL) DAILY 90 tablet 1  . azelastine (OPTIVAR) 0.05 % ophthalmic solution 1 drop 2 (two) times daily.  0  . fluorometholone (FML) 0.1 % ophthalmic suspension Place 1 drop into both eyes 4 (four) times daily.  0  . traMADol (ULTRAM) 50 MG tablet Take 1 tablet (50 mg total) by mouth every 12 (twelve) hours as needed. (Patient not taking: Reported  on 09/19/2014) 30 tablet 0   No current facility-administered medications on file prior to visit.    Allergies  Allergen Reactions  . Codeine Sulfate Itching  . Imitrex [Sumatriptan]   . Percocet [Oxycodone-Acetaminophen]     History   Social History  . Marital Status: Married    Spouse Name: N/A  . Number of Children: 0  . Years of Education: N/A   Occupational History  .     Social History Main Topics  . Smoking status: Never Smoker   . Smokeless tobacco: Never Used  . Alcohol Use: 0.0 oz/week    0 Standard drinks or equivalent per week  . Drug Use: No  . Sexual Activity: Not on file   Other Topics Concern  . Not on file   Social History Narrative    Family History  Problem Relation Age of Onset  . Arthritis Mother   . Hypertension Father   . Alcohol abuse Father   . Stroke Father   . Arthritis Maternal Grandmother   . Heart disease Maternal Grandfather   . Hypertension Maternal Grandfather   . Cancer Neg Hx   . Colon cancer Neg Hx   . Ovarian cancer Neg Hx   . Breast cancer Neg Hx     The following portions of the patient's history were reviewed and updated as appropriate: allergies, current medications, past family history, past medical history, past social history, past surgical history and  problem list.  Review of Systems ROS. Negative except for HPI positives.   Objective:   BP 130/82 mmHg  Pulse 84  Ht 5\' 4"  (1.626 m)  Wt 188 lb 1.6 oz (85.322 kg)  BMI 32.27 kg/m2 CONSTITUTIONAL: Well-developed, well-nourished female in no acute distress.  HENT:  Normocephalic, atraumatic, External right and left ear normal. Oropharynx is clear and moist EYES: Conjunctivae and EOM are normal.No scleral icterus.  NECK: Normal range of motion, supple, no masses.  Normal thyroid.  SKIN: Skin is warm and dry. No rash noted. Not diaphoretic. No erythema. No pallor. NEUROLGIC: Alert and oriented to person, place, and time. Normal reflexes, muscle tone  coordination. No cranial nerve deficit noted. PSYCHIATRIC: Normal mood and affect. Normal behavior. Normal judgment and thought content. CARDIOVASCULAR: Normal heart rate noted, regular rhythm RESPIRATORY: Clear to auscultation bilaterally. Effort and breath sounds normal, no problems with respiration noted. BREASTS: Symmetric in size. No masses, skin changes, nipple drainage, or lymphadenopathy. Saline implants present. ABDOMEN: Soft, normal bowel sounds, no distention noted.  No tenderness, rebound or guarding. ML incision well healed. No hernias. PELVIC: Normal appearing external genitalia; normal appearing vaginal mucosa and cervix.  Normal appearing discharge.  Pap smear not obtained. Cervix/uterus surgically absent. Bimanual exam with 3/4 cuff tenderness;1 cm nodule noted at lt side of cuff on RV exam. RV: normal sphincter tone; no rectal masses.. MUSCULOSKELETAL: Normal range of motion. No tenderness.  No cyanosis, clubbing, or edema.  2+ distal pulses.   Assessment:   Annual gynecologic examination 49 y.o. Contraception: status post hysterectomy  Overweight Patient Active Problem List   Diagnosis Date Noted  . Chronic female pelvic pain 07/21/2014  . Decreased libido 07/21/2014  . Coitalgia 07/21/2014  . History of migraine headaches 07/21/2014  . Adult hypothyroidism 07/21/2014  . Increased BMI 07/21/2014  . Osteopenia 07/21/2014  . Allergic rhinitis, seasonal 07/21/2014  . Postsurgical menopause 07/21/2014  . Dryness of vagina 07/21/2014  . Back pain 06/17/2014  . History of itching of eye 06/17/2014  . Health care maintenance 06/17/2014  . Hypertension 09/18/2012  . Endometriosis 09/18/2012  . Headache 09/18/2012     Plan:  Pap: Pap, Reflex if ASCUS, Pap Co Test, GC/CT NAAT and Not done Mammogram: Ordered Labs: per Dr. Lorin PicketScott Routine preventative health maintenance measures emphasized: Diet/Weight control and Alcohol/Drug use Plan to discontinue the Femring at this  time.  Micronized progesterone will be increased to 200 mg at night.  Patient will follow-up regarding symptomatology the next 3 months. Return to Clinic - 1 Year   Scheryl MartenMarth Anelisse Jacobson, MD

## 2014-09-20 MED ORDER — PROGESTERONE MICRONIZED 200 MG PO CAPS: 200.0000 mg | ORAL_CAPSULE | Freq: Every day | ORAL | Status: DC

## 2014-09-20 NOTE — Addendum Note (Signed)
Addended by: Marchelle FolksMILLER, Deysha Cartier G on: 09/20/2014 10:50 AM   Modules accepted: Orders

## 2014-11-23 ENCOUNTER — Other Ambulatory Visit: Payer: Self-pay | Admitting: Internal Medicine

## 2014-12-12 ENCOUNTER — Ambulatory Visit: Payer: BLUE CROSS/BLUE SHIELD | Admitting: Internal Medicine

## 2014-12-15 ENCOUNTER — Encounter: Payer: Self-pay | Admitting: Internal Medicine

## 2014-12-17 ENCOUNTER — Other Ambulatory Visit: Payer: Self-pay | Admitting: Surgical

## 2014-12-17 MED ORDER — FLUTICASONE PROPIONATE 50 MCG/ACT NA SUSP: 2.0000 | Freq: Every day | NASAL | Status: DC

## 2014-12-25 ENCOUNTER — Encounter: Payer: Self-pay | Admitting: Obstetrics and Gynecology

## 2014-12-25 ENCOUNTER — Ambulatory Visit (INDEPENDENT_AMBULATORY_CARE_PROVIDER_SITE_OTHER): Payer: BLUE CROSS/BLUE SHIELD | Admitting: Obstetrics and Gynecology

## 2014-12-25 VITALS — BP 118/81 | HR 81 | Ht 65.0 in | Wt 185.1 lb

## 2014-12-25 DIAGNOSIS — N949 Unspecified condition associated with female genital organs and menstrual cycle: Secondary | ICD-10-CM | POA: Diagnosis not present

## 2014-12-25 DIAGNOSIS — N809 Endometriosis, unspecified: Secondary | ICD-10-CM

## 2014-12-25 DIAGNOSIS — G8929 Other chronic pain: Secondary | ICD-10-CM | POA: Diagnosis not present

## 2014-12-25 DIAGNOSIS — R102 Pelvic and perineal pain: Secondary | ICD-10-CM

## 2014-12-25 NOTE — Progress Notes (Signed)
Patient ID: Tracy Ward, female   DOB: 03/09/1966, 49 y.o.   MRN: [id number] 3 month f/u on medication D/c femring prometrium increased 200 qhs Having hot flashes, insomnia  Chief complaint: 1.  Chronic pelvic pain. 2.  History of endometriosis. 3.  Status postTAH/BSO  CONFERENCE NOTE: Patient had exacerbation of chronic pelvic pain while on Femring and Prometrium 100 mg daily.  The Femring was discontinued.  Prometrium was increased to 200 mg daily.  Over the past 3 months, She has not noted any significant change in her symptomatology. Minimal vasomotor symptoms are present.  She has not been engaging in sexual intercourse because of the pain experienced in recent past.  Management options were reviewed.  At this time she wishes to remain off of estrogen replacement therapy and would like to continue with the Prometrium 200 mg daily.  She will return in 3 months for follow-up and further management planning.  Physical exam is deferred today.  PLAN: 1.  Continue with Prometrium 200 mg daily 2.  Monitor symptomatology. 3.  Return in 3 months for follow-up and physical exam for further management planning.  A total of 15 minutes were spent face-to-face with the patient during this encounter and over half of that time dealt with counseling and coordination of care.  Herold Harms, MD

## 2015-02-23 ENCOUNTER — Other Ambulatory Visit: Payer: Self-pay | Admitting: Internal Medicine

## 2015-02-25 NOTE — Telephone Encounter (Signed)
Needs appt

## 2015-03-26 ENCOUNTER — Encounter: Payer: Self-pay | Admitting: Obstetrics and Gynecology

## 2015-03-26 ENCOUNTER — Ambulatory Visit (INDEPENDENT_AMBULATORY_CARE_PROVIDER_SITE_OTHER): Payer: BLUE CROSS/BLUE SHIELD | Admitting: Obstetrics and Gynecology

## 2015-03-26 VITALS — BP 146/84 | HR 90 | Ht 65.0 in | Wt 186.4 lb

## 2015-03-26 DIAGNOSIS — N949 Unspecified condition associated with female genital organs and menstrual cycle: Secondary | ICD-10-CM | POA: Diagnosis not present

## 2015-03-26 DIAGNOSIS — R102 Pelvic and perineal pain: Secondary | ICD-10-CM

## 2015-03-26 DIAGNOSIS — G8929 Other chronic pain: Secondary | ICD-10-CM | POA: Diagnosis not present

## 2015-03-26 DIAGNOSIS — E8940 Asymptomatic postprocedural ovarian failure: Secondary | ICD-10-CM

## 2015-03-26 DIAGNOSIS — N958 Other specified menopausal and perimenopausal disorders: Secondary | ICD-10-CM | POA: Diagnosis not present

## 2015-03-26 DIAGNOSIS — N809 Endometriosis, unspecified: Secondary | ICD-10-CM

## 2015-03-26 MED ORDER — ESTRADIOL 1 MG PO TABS: 1.0000 mg | ORAL_TABLET | Freq: Every day | ORAL | Status: DC

## 2015-03-26 MED ORDER — PROGESTERONE MICRONIZED 200 MG PO CAPS: 200.0000 mg | ORAL_CAPSULE | Freq: Every day | ORAL | Status: DC

## 2015-03-26 NOTE — Patient Instructions (Signed)
1.  Continue Prometrium 200 mg a day. 2.  Estradiol 1 mg daily 3.  Return in 3 months for follow-up

## 2015-03-26 NOTE — Addendum Note (Signed)
Addended by: Sharon SellerEFRANCESCO, MARTIN on: 03/26/2015 08:55 PM   Modules accepted: Level of Service

## 2015-03-26 NOTE — Progress Notes (Signed)
Patient ID: Tracy Ward, female   DOB: [birth year], 49 y.o.   MRN: [id number]   3 month f/u on cpp- endometriosis  Prometrium 200mg  qd Sx- only with IC- slightly better Wants estrogen  Chief complaint: 1.  Chronic pelvic pain. 2.  History of endometriosis. 3.  Status post TAH/BSO.  The patient presents today for 3 month follow-up after switching HRT therapy.  Femring was discontinued 3 months ago.  Prometrium was increased from 100-200 mg daily. Tracy Ward has noted a slight decrease in her pelvic pain but has since developed significant increase in vasomotor symptoms and vaginal dryness.  She would like to return to estrogen replacement therapy in addition to her Prometrium.  Exam in June 2016 demonstrated a tender vaginal cuff with some left-sided nodularity.  This was thought to be due to a flare of endometriosis.  Past medical history, past surgical history, problems, medications, and allergies are reviewed.  OBJECTIVE: BP 146/84 mmHg  Pulse 90  Ht 5\' 5"  (1.651 m)  Wt 186 lb 6.4 oz (84.55 kg)  BMI 31.02 kg/m2 Well-appearing female in no acute distress.  She is alert and oriented. Back: No CVA tenderness. Abdomen: Soft, nontender, without organomegaly; no pelvic masses. Pelvic: External genitalia-normal BUS-normal. Vagina-with fair estrogen effect; vaginal cuff intact. Cervix-surgically absent Uterus-surgically absent. 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  Vaginal cuff tenderness with no evidence of previously identified nodularity. Rectal-normal sphincter tone,; no rectal masses.  ASSESSMENT: 1.  Following discontinuation of estrogen supplementation, there has been a decrease in the endometriosis symptomatology. 2.  Significant vasomotor symptoms and vaginal dryness symptoms are unacceptable.  PLAN: 1.  Continue with Prometrium 200 mg a day. 2.  Add estradiol 1 mg by mouth daily 3.  Monitor symptomatology and return in 3 months for  follow-up.  Prentice DockerMartin A Criss Pallone, MD  A total of 25 minutes were spent face-to-face with the patient during this encounter and over half of that time involved counseling and coordination of care.  Note: This dictation was prepared with Dragon dictation along with smaller phrase technology. Any transcriptional errors that result from this process are unintentional.

## 2015-05-24 ENCOUNTER — Other Ambulatory Visit: Payer: Self-pay | Admitting: Internal Medicine

## 2015-05-24 NOTE — Telephone Encounter (Signed)
Norvasc and Aldactone filled

## 2015-06-25 ENCOUNTER — Ambulatory Visit: Payer: BLUE CROSS/BLUE SHIELD | Admitting: Obstetrics and Gynecology

## 2015-09-25 ENCOUNTER — Encounter: Payer: BLUE CROSS/BLUE SHIELD | Admitting: Obstetrics and Gynecology

## 2016-01-13 ENCOUNTER — Other Ambulatory Visit: Payer: Self-pay | Admitting: Internal Medicine

## 2016-04-09 ENCOUNTER — Other Ambulatory Visit: Payer: Self-pay | Admitting: Obstetrics and Gynecology

## 2016-04-15 ENCOUNTER — Other Ambulatory Visit: Payer: Self-pay

## 2016-04-22 ENCOUNTER — Other Ambulatory Visit: Payer: Self-pay | Admitting: Obstetrics and Gynecology

## 2016-04-23 ENCOUNTER — Telehealth: Payer: Self-pay | Admitting: Obstetrics and Gynecology

## 2016-04-23 MED ORDER — ESTRADIOL 1 MG PO TABS: 1.0000 mg | ORAL_TABLET | Freq: Every day | ORAL | 0 refills | Status: DC

## 2016-04-23 MED ORDER — PROGESTERONE MICRONIZED 200 MG PO CAPS: 200.0000 mg | ORAL_CAPSULE | Freq: Every day | ORAL | 0 refills | Status: DC

## 2016-04-23 NOTE — Telephone Encounter (Signed)
Pt aware she needs an appt. Appt made for 05/13/16- made for f/u endometriosis d/t pt not having any insurance at this time. 30 day supply of estradiol and Prometrium erx.

## 2016-04-23 NOTE — Telephone Encounter (Signed)
lmtrc

## 2016-04-23 NOTE — Telephone Encounter (Signed)
Thi spt said [facility] told her they have several request for estradiol and progesterone and we have not sent it back.

## 2016-05-11 ENCOUNTER — Telehealth: Payer: Self-pay | Admitting: Obstetrics and Gynecology

## 2016-05-11 MED ORDER — ESTRADIOL 1 MG PO TABS: 1.0000 mg | ORAL_TABLET | Freq: Every day | ORAL | 0 refills | Status: DC

## 2016-05-11 MED ORDER — PROGESTERONE MICRONIZED 200 MG PO CAPS: 200.0000 mg | ORAL_CAPSULE | Freq: Every day | ORAL | 0 refills | Status: DC

## 2016-05-11 NOTE — Telephone Encounter (Signed)
PT NEEDS ESTRODIAL AND PROGESTON FILLED. I HAD TO MOVE HER APPT OUT B/C DR DE IN [place]

## 2016-05-11 NOTE — Telephone Encounter (Signed)
Pt aware per vm meds erx.

## 2016-05-13 ENCOUNTER — Encounter: Payer: BLUE CROSS/BLUE SHIELD | Admitting: Obstetrics and Gynecology

## 2016-05-27 ENCOUNTER — Ambulatory Visit (INDEPENDENT_AMBULATORY_CARE_PROVIDER_SITE_OTHER): Payer: Self-pay | Admitting: Obstetrics and Gynecology

## 2016-05-27 ENCOUNTER — Encounter: Payer: Self-pay | Admitting: Obstetrics and Gynecology

## 2016-05-27 VITALS — BP 147/84 | HR 84 | Ht 65.0 in | Wt 185.7 lb

## 2016-05-27 DIAGNOSIS — N809 Endometriosis, unspecified: Secondary | ICD-10-CM

## 2016-05-27 DIAGNOSIS — R102 Pelvic and perineal pain: Secondary | ICD-10-CM

## 2016-05-27 DIAGNOSIS — G8929 Other chronic pain: Secondary | ICD-10-CM

## 2016-05-27 MED ORDER — ESTRADIOL 1 MG PO TABS: 1.0000 mg | ORAL_TABLET | Freq: Every day | ORAL | 0 refills | Status: DC

## 2016-05-27 MED ORDER — PROGESTERONE MICRONIZED 200 MG PO CAPS
200.0000 mg | ORAL_CAPSULE | Freq: Every day | ORAL | 0 refills | Status: DC
Start: 2016-05-27 — End: 2016-06-02

## 2016-05-27 NOTE — Patient Instructions (Signed)
1. Continue estradiol 1 mg a day 2. Continue Prometrium 200 mg a day 3. Call back to schedule annual exam within the year

## 2016-05-28 NOTE — Progress Notes (Signed)
Chief complaint: 1. Surgical menopause 2. Symptomatic endometriosis with history of dyspareunia  Patient presents for follow-up on HRT therapy. 3 months ago estradiol 1 mg a day was added to her Prometrium 200 mg a day. She is status post TAH/BSO due to stage IV endometriosis. She is status post [facility] greater than 15 years ago. Her primary complaints prior to starting the estradiol was dyspareunia. Since going on the additional estradiol, she has noted improvement in her symptomatology. She is pleased with current regimen and wishes no changes.  Past medical history, past surgical history, problem list, medications, and allergies are reviewed  OBJECTIVE: BP (!) 147/84   Pulse 84   Ht 5\' 5"  (1.651 m)   Wt 185 lb 11.2 oz (84.2 kg)   BMI 30.90 kg/m  Physical exam-deferred  ASSESSMENT: 1. Surgical menopause, with improvement in symptomatology with addition of estradiol 1 mg daily 2. Decrease in severity of dyspareunia 3. History of endometriosis, status post TAH/BSO  PLAN: 1. Continue with estradiol 1 mg a day 2. Continue with Prometrium 200 mg daily 3. Return later this year for annual exam  A total of 15 minutes were spent face-to-face with the patient during this encounter and over half of that time dealt with counseling and coordination of care.  Herold HarmsMartin A Terrisa Curfman, MD  Note: This dictation was prepared with Dragon dictation along with smaller phrase technology. Any transcriptional errors that result from this process are unintentional.

## 2016-06-02 ENCOUNTER — Telehealth: Payer: Self-pay | Admitting: Obstetrics and Gynecology

## 2016-06-02 MED ORDER — ESTRADIOL 1 MG PO TABS: 1.0000 mg | ORAL_TABLET | Freq: Every day | ORAL | 0 refills | Status: DC

## 2016-06-02 MED ORDER — PROGESTERONE MICRONIZED 200 MG PO CAPS: 200.0000 mg | ORAL_CAPSULE | Freq: Every day | ORAL | 0 refills | Status: DC

## 2016-06-02 NOTE — Addendum Note (Signed)
Addended by: Marchelle FolksMILLER, Hedwig Mcfall G on: 06/02/2016 04:22 PM   Modules accepted: Orders

## 2016-06-02 NOTE — Telephone Encounter (Signed)
Pt aware med was confirmed by pharmacy on 2/7 at 1:35. Med resent. Pt will contact office if she is unable to get meds.

## 2016-06-02 NOTE — Telephone Encounter (Signed)
Pt said her medication estradiol, progesterone was not sent to pharmacy 90 day supply ([facility] [address])

## 2016-08-20 ENCOUNTER — Ambulatory Visit (INDEPENDENT_AMBULATORY_CARE_PROVIDER_SITE_OTHER): Payer: Self-pay | Admitting: Family Medicine

## 2016-08-20 ENCOUNTER — Encounter: Payer: Self-pay | Admitting: Family Medicine

## 2016-08-20 ENCOUNTER — Other Ambulatory Visit: Payer: Self-pay | Admitting: Internal Medicine

## 2016-08-20 DIAGNOSIS — B029 Zoster without complications: Secondary | ICD-10-CM

## 2016-08-20 MED ORDER — VALACYCLOVIR HCL 1 G PO TABS: 1000.0000 mg | ORAL_TABLET | Freq: Three times a day (TID) | ORAL | 0 refills | Status: DC

## 2016-08-20 NOTE — Progress Notes (Signed)
   Subjective:  Patient ID: Tracy Ward, female    DOB: [birth year]  Age: 51 y.o. MRN: [id number]  CC: Rash  HPI:  51 year old female presents with complaints of rash.  Patient states that it started on Monday. Started on the back, left side. She has some rash underneath the left breast. She states it is quite painful and sensitive. Moderate in severity. No new exposures. No known insect bite. No known exacerbating or relieving factors. No other associated symptoms. No other complaints or concerns at this time.  Social Hx   Social History   Social History  . Marital status: Married    Spouse name: N/A  . Number of children: 0  . Years of education: N/A   Occupational History  .  [employer]   Social History Main Topics  . Smoking status: Never Smoker  . Smokeless tobacco: Never Used  . Alcohol use 0.0 oz/week  . Drug use: No  . Sexual activity: Yes    Birth control/ protection: Surgical   Other Topics Concern  . None   Social History Narrative  . None    Review of Systems  Constitutional: Negative.   Skin: Positive for rash.   Objective:  BP (!) 148/88   Pulse 81   Temp 98.1 F (36.7 C) (Oral)   Wt 184 lb 2 oz (83.5 kg)   SpO2 97%   BMI 30.64 kg/m   BP/Weight 08/20/2016 05/27/2016 03/26/2015  Systolic BP 148 147 146  Diastolic BP 88 84 84  Wt. (Lbs) 184.13 185.7 186.4  BMI 30.64 30.9 31.02    Physical Exam  Constitutional: She is oriented to person, place, and time. She appears well-developed. No distress.  Pulmonary/Chest: Effort normal.  Neurological: She is alert and oriented to person, place, and time.  Skin:  L thoracic region - patchy of vesicles with surrounding erythema. Tender to palpation. There is a similarly appearing area underneath the left breast.  Psychiatric: She has a normal mood and affect.  Vitals reviewed.   Lab Results  Component Value Date   WBC 6.8 06/14/2014   HGB 15.0 06/14/2014   HCT 43.7 06/14/2014   PLT  302.0 06/14/2014   GLUCOSE 94 06/14/2014   CHOL 198 06/14/2014   TRIG 176.0 (H) 06/14/2014   HDL 49.10 06/14/2014   LDLCALC 114 (H) 06/14/2014   ALT 22 06/14/2014   AST 24 06/14/2014   NA 137 06/14/2014   K 3.8 06/14/2014   CL 101 06/14/2014   CREATININE 1.00 06/14/2014   BUN 13 06/14/2014   CO2 24 06/14/2014   TSH 3.42 07/16/2014    Assessment & Plan:   Problem List Items Addressed This Visit    Herpes zoster    New problem. Treating with Valtrex.       Relevant Medications   valACYclovir (VALTREX) 1000 MG tablet      Meds ordered this encounter  Medications  . valACYclovir (VALTREX) 1000 MG tablet    Sig: Take 1 tablet (1,000 mg total) by mouth 3 (three) times daily.    Dispense:  21 tablet    Refill:  0   Follow-up: PRN  Everlene OtherJayce Benjamine Strout DO [facility] [place]

## 2016-08-20 NOTE — Progress Notes (Signed)
Pre visit review using our clinic review tool, if applicable. No additional management support is needed unless otherwise documented below in the visit note. 

## 2016-08-20 NOTE — Patient Instructions (Signed)
Shingles Shingles, which is also known as herpes zoster, is an infection that causes a painful skin rash and fluid-filled blisters. Shingles is not related to genital herpes, which is a sexually transmitted infection. Shingles only develops in people who:  Have had chickenpox.  Have received the chickenpox vaccine. (This is rare.)  What are the causes? Shingles is caused by varicella-zoster virus (VZV). This is the same virus that causes chickenpox. After exposure to VZV, the virus stays in the body in an inactive (dormant) state. Shingles develops if the virus reactivates. This can happen many years after the initial exposure to VZV. It is not known what causes this virus to reactivate. What increases the risk? People who have had chickenpox or received the chickenpox vaccine are at risk for shingles. Infection is more common in people who:  Are older than age 50.  Have a weakened defense (immune) system, such as those with HIV, AIDS, or cancer.  Are taking medicines that weaken the immune system, such as transplant medicines.  Are under great stress.  What are the signs or symptoms? Early symptoms of this condition include itching, tingling, and pain in an area on your skin. Pain may be described as burning, stabbing, or throbbing. A few days or weeks after symptoms start, a painful red rash appears, usually on one side of the body in a bandlike or beltlike pattern. The rash eventually turns into fluid-filled blisters that break open, scab over, and dry up in about 2-3 weeks. At any time during the infection, you may also develop:  A fever.  Chills.  A headache.  An upset stomach.  How is this diagnosed? This condition is diagnosed with a skin exam. Sometimes, skin or fluid samples are taken from the blisters before a diagnosis is made. These samples are examined under a microscope or sent to a lab for testing. How is this treated? There is no specific cure for this condition.  Your health care provider will probably prescribe medicines to help you manage pain, recover more quickly, and avoid long-term problems. Medicines may include:  Antiviral drugs.  Anti-inflammatory drugs.  Pain medicines.  If the area involved is on your face, you may be referred to a specialist, such as an eye doctor (ophthalmologist) or an ear, nose, and throat (ENT) doctor to help you avoid eye problems, chronic pain, or disability. Follow these instructions at home: Medicines  Take medicines only as directed by your health care provider.  Apply an anti-itch or numbing cream to the affected area as directed by your health care provider. Blister and Rash Care  Take a cool bath or apply cool compresses to the area of the rash or blisters as directed by your health care provider. This may help with pain and itching.  Keep your rash covered with a loose bandage (dressing). Wear loose-fitting clothing to help ease the pain of material rubbing against the rash.  Keep your rash and blisters clean with mild soap and cool water or as directed by your health care provider.  Check your rash every day for signs of infection. These include redness, swelling, and pain that lasts or increases.  Do not pick your blisters.  Do not scratch your rash. General instructions  Rest as directed by your health care provider.  Keep all follow-up visits as directed by your health care provider. This is important.  Until your blisters scab over, your infection can cause chickenpox in people who have never had it or been vaccinated   against it. To prevent this from happening, avoid contact with other people, especially: ? Babies. ? Pregnant women. ? Children who have eczema. ? Elderly people who have transplants. ? People who have chronic illnesses, such as leukemia or AIDS. Contact a health care provider if:  Your pain is not relieved with prescribed medicines.  Your pain does not get better after  the rash heals.  Your rash looks infected. Signs of infection include redness, swelling, and pain that lasts or increases. Get help right away if:  The rash is on your face or nose.  You have facial pain, pain around your eye area, or loss of feeling on one side of your face.  You have ear pain or you have ringing in your ear.  You have loss of taste.  Your condition gets worse. This information is not intended to replace advice given to you by your health care provider. Make sure you discuss any questions you have with your health care provider. Document Released: 04/06/2005 Document Revised: 12/01/2015 Document Reviewed: 02/15/2014 Elsevier Interactive Patient Education  2017 Elsevier Inc.  

## 2016-08-20 NOTE — Assessment & Plan Note (Signed)
New problem. Treating with Valtrex.

## 2016-08-20 NOTE — Telephone Encounter (Signed)
Last OV was in 2016 with you.  Had an acute visit with Dr. Adriana Simasook today, Please advise for refill, thanks

## 2016-08-21 NOTE — Telephone Encounter (Signed)
She has not had labs or visit with me since 2016.  Needs labs and appt.

## 2016-08-23 ENCOUNTER — Other Ambulatory Visit: Payer: Self-pay | Admitting: Internal Medicine

## 2016-08-31 ENCOUNTER — Telehealth: Payer: Self-pay | Admitting: Internal Medicine

## 2016-08-31 ENCOUNTER — Other Ambulatory Visit: Payer: Self-pay | Admitting: Family Medicine

## 2016-08-31 MED ORDER — GABAPENTIN 300 MG PO CAPS: 300.0000 mg | ORAL_CAPSULE | Freq: Three times a day (TID) | ORAL | 3 refills | Status: DC

## 2016-08-31 NOTE — Telephone Encounter (Signed)
Spoke with patient she states neurontin is working for her.   Would like script called in for her.

## 2016-08-31 NOTE — Telephone Encounter (Signed)
If the gabapentin is working, I am happy to continue. Do I need to send this in? Follow up with PCP.

## 2016-08-31 NOTE — Telephone Encounter (Signed)
Pt called and stated that she was seen 10 days ago for Shingles. Dr. Adriana Simasook informed pt at that time that if it could not be managed that we may need to call in medication like gabapentin. Pt states that the shingles have increasingly got worse. Pt would like to know if we could call in an rx. Please advise, thanks. Please advise, thank you!  Call pt @ [phone]

## 2016-08-31 NOTE — Telephone Encounter (Signed)
Spoke with patient states seen for shingles 10 days ago. Pain has gotten increasingly worse.   Had script for gabapentin 300 mg took 1 pill   Tid then 2 pills in q hs to  help relieve the pain.   She states this seems to be working.  Please advise.

## 2016-08-31 NOTE — Telephone Encounter (Signed)
Rx sent 

## 2016-08-31 NOTE — Telephone Encounter (Signed)
Patient advised and verbalized understanding 

## 2016-11-26 ENCOUNTER — Other Ambulatory Visit: Payer: Self-pay | Admitting: Obstetrics and Gynecology

## 2017-02-21 ENCOUNTER — Other Ambulatory Visit: Payer: Self-pay | Admitting: Internal Medicine

## 2017-02-21 ENCOUNTER — Other Ambulatory Visit: Payer: Self-pay | Admitting: Obstetrics and Gynecology

## 2017-02-22 NOTE — Telephone Encounter (Signed)
Patient has not seen PCP since 2016 Saw Dr. Adriana Simasook 08/31/16

## 2017-02-22 NOTE — Telephone Encounter (Signed)
Please let her know that I have not seen her since 2016 and am unable to continue to refill without seeing her.  Is she seeing someone else?  If so, then pharmacy needs to forward refill request to that provider.  If she is not seeing someone else, she needs labs asap and also needs to be scheduled an appt and then can refill until appt only if labs are scheduled (and done).

## 2017-02-24 ENCOUNTER — Other Ambulatory Visit: Payer: Self-pay | Admitting: Internal Medicine

## 2017-02-24 DIAGNOSIS — I10 Essential (primary) hypertension: Secondary | ICD-10-CM

## 2017-02-24 NOTE — Telephone Encounter (Signed)
Patient currently has no Insurance and has to go out of the [place], in the next month sh e has enough medication to last about two weeks she is willing to schedule the minimum labs needed and have those done and then scheule appointment with provider when she return if she can get a 30 day supply of medication.

## 2017-02-24 NOTE — Telephone Encounter (Signed)
If she wants just the minimum - given her medication, I would like to at least check met b.  (we can check her cholesterol, thyroid and other blood counts when she is able).  I would at least like to get the met b.  This can be non fasting.  Please schedule and then ok to refill medications .   I have placed the order for the met b.  Thanks

## 2017-02-24 NOTE — Progress Notes (Signed)
Order placed for met b 

## 2017-02-25 NOTE — Telephone Encounter (Signed)
See attached.  Need to schedule met b and then ok to refill medication x 1.

## 2017-05-11 ENCOUNTER — Encounter: Payer: Self-pay | Admitting: Internal Medicine

## 2017-05-11 ENCOUNTER — Ambulatory Visit (INDEPENDENT_AMBULATORY_CARE_PROVIDER_SITE_OTHER): Payer: Self-pay | Admitting: Internal Medicine

## 2017-05-11 VITALS — BP 142/86 | HR 80 | Temp 98.5°F | Resp 18 | Wt 180.6 lb

## 2017-05-11 DIAGNOSIS — Z1239 Encounter for other screening for malignant neoplasm of breast: Secondary | ICD-10-CM

## 2017-05-11 DIAGNOSIS — Z1231 Encounter for screening mammogram for malignant neoplasm of breast: Secondary | ICD-10-CM

## 2017-05-11 DIAGNOSIS — N809 Endometriosis, unspecified: Secondary | ICD-10-CM

## 2017-05-11 DIAGNOSIS — I10 Essential (primary) hypertension: Secondary | ICD-10-CM

## 2017-05-11 MED ORDER — AMLODIPINE BESYLATE 10 MG PO TABS: 10.0000 mg | ORAL_TABLET | Freq: Every day | ORAL | 0 refills | Status: DC

## 2017-05-11 MED ORDER — SPIRONOLACTONE 25 MG PO TABS: 25.0000 mg | ORAL_TABLET | Freq: Every day | ORAL | 0 refills | Status: DC

## 2017-05-11 NOTE — Progress Notes (Signed)
Patient ID: Tracy Ward, female   DOB: [birth year], 52 y.o.   MRN: [id number]   Subjective:    Patient ID: Tracy Ward, female    DOB: 1965/09/09, 52 y.o.   MRN: [id number]  HPI  Patient here for a scheduled follow up.  I have not seen her since 08/2014.  Was instructed to come in for evaluation.  Has been seeing Dr Odis Luster for shoulder fracture.  Has f/u in two weeks.  PT.  Overall she feels she is doing relatively well.  Staying active.  No chest pain.  No sob.  No acid reflux.  No abdominal pain.  Bowels moving. She has been off amlodipine.  States ran out of medication.  Reports only taking spironolactone now.  States blood pressure has been averaging 120-130/80s.  Discussed importance of mammogram and regular follow up appts.  Information given regarding [facility] - for scheduling mammogram.    Past Medical History:  Diagnosis Date  . Endometriosis   . Hx of migraines   . Hypertension   . Seasonal allergies    Past Surgical History:  Procedure Laterality Date  . BREAST SURGERY  06/2004   augmentation  . LAPAROSCOPY  1999   Endometriosis  . LAPAROTOMY  1994   Endometriosis  . TONSILLECTOMY  1997   Partial  . TOTAL ABDOMINAL HYSTERECTOMY W/ BILATERAL SALPINGOOPHORECTOMY  1999   Family History  Problem Relation Age of Onset  . Arthritis Mother   . Hypertension Father   . Alcohol abuse Father   . Stroke Father   . Arthritis Maternal Grandmother   . Heart disease Maternal Grandfather   . Hypertension Maternal Grandfather   . Cancer Neg Hx   . Colon cancer Neg Hx   . Ovarian cancer Neg Hx   . Breast cancer Neg Hx    Social History   Socioeconomic History  . Marital status: Married    Spouse name: None  . Number of children: 0  . Years of education: None  . Highest education level: None  Social Needs  . Financial resource strain: None  . Food insecurity - worry: None  . Food insecurity - inability: None  . Transportation needs - medical: None  .  Transportation needs - non-medical: None  Occupational History    Employer: [employer]  Tobacco Use  . Smoking status: Never Smoker  . Smokeless tobacco: Never Used  Substance and Sexual Activity  . Alcohol use: Yes    Alcohol/week: 0.0 oz  . Drug use: No  . Sexual activity: Yes    Birth control/protection: Surgical  Other Topics Concern  . None  Social History Narrative  . None    Outpatient Encounter Medications as of 05/11/2017  Medication Sig  . amLODipine (NORVASC) 10 MG tablet Take 1 tablet (10 mg total) by mouth daily.  Marland Kitchen estradiol (ESTRACE) 1 MG tablet Take 1 tablet (1 mg total) by mouth daily.  . progesterone (PROMETRIUM) 200 MG capsule Take 1 capsule (200 mg total) by mouth at bedtime.  Marland Kitchen spironolactone (ALDACTONE) 25 MG tablet Take 1 tablet (25 mg total) by mouth daily.  . [DISCONTINUED] amLODipine (NORVASC) 10 MG tablet TAKE 1 TABLET BY MOUTH ONCE DAILY  . [DISCONTINUED] spironolactone (ALDACTONE) 25 MG tablet TAKE 1 TABLET BY MOUTH ONCE DAILY  . cetirizine (ZYRTEC) 10 MG tablet Take 10 mg by mouth daily.  . fluticasone (FLONASE) 50 MCG/ACT nasal spray Place 2 sprays into both nostrils daily. (Patient not taking: Reported on  05/11/2017)  . gabapentin (NEURONTIN) 300 MG capsule Take 1 capsule (300 mg total) by mouth 3 (three) times daily. (Patient not taking: Reported on 05/11/2017)  . valACYclovir (VALTREX) 1000 MG tablet Take 1 tablet (1,000 mg total) by mouth 3 (three) times daily. (Patient not taking: Reported on 05/11/2017)  . [DISCONTINUED] estradiol (ESTRACE) 1 MG tablet TAKE 1 TABLET BY MOUTH ONCE DAILY  . [DISCONTINUED] progesterone (PROMETRIUM) 200 MG capsule TAKE 1 CAPSULE BY MOUTH AT BEDTIME   No facility-administered encounter medications on file as of 05/11/2017.     Review of Systems  Constitutional: Negative for appetite change and unexpected weight change.  HENT: Negative for congestion and sinus pressure.   Respiratory: Negative for cough,  chest tightness and shortness of breath.   Cardiovascular: Negative for chest pain, palpitations and leg swelling.  Gastrointestinal: Negative for abdominal pain, diarrhea, nausea and vomiting.  Genitourinary: Negative for difficulty urinating and dysuria.  Musculoskeletal: Negative for joint swelling and myalgias.  Skin: Negative for color change and rash.  Neurological: Negative for dizziness, light-headedness and headaches.  Psychiatric/Behavioral: Negative for agitation and dysphoric mood.       Objective:     Blood pressure rechecked by me:  148/88  Physical Exam  Constitutional: She appears well-developed and well-nourished. No distress.  HENT:  Nose: Nose normal.  Mouth/Throat: Oropharynx is clear and moist.  Neck: Neck supple. No thyromegaly present.  Cardiovascular: Normal rate and regular rhythm.  Pulmonary/Chest: Breath sounds normal. No respiratory distress. She has no wheezes.  Abdominal: Soft. Bowel sounds are normal. There is no tenderness.  Musculoskeletal: She exhibits no edema or tenderness.  Lymphadenopathy:    She has no cervical adenopathy.  Skin: No rash noted. No erythema.  Psychiatric: She has a normal mood and affect. Her behavior is normal.    BP (!) 142/86 (BP Location: Left Arm, Patient Position: Sitting, Cuff Size: Large)   Pulse 80   Temp 98.5 F (36.9 C) (Oral)   Resp 18   Wt 180 lb 9.6 oz (81.9 kg)   SpO2 99%   BMI 30.05 kg/m  Wt Readings from Last 3 Encounters:  05/11/17 180 lb 9.6 oz (81.9 kg)  08/20/16 184 lb 2 oz (83.5 kg)  05/27/16 185 lb 11.2 oz (84.2 kg)     Lab Results  Component Value Date   WBC 6.8 06/14/2014   HGB 15.0 06/14/2014   HCT 43.7 06/14/2014   PLT 302.0 06/14/2014   GLUCOSE 94 06/14/2014   CHOL 198 06/14/2014   TRIG 176.0 (H) 06/14/2014   HDL 49.10 06/14/2014   LDLCALC 114 (H) 06/14/2014   ALT 22 06/14/2014   AST 24 06/14/2014   NA 137 06/14/2014   K 3.8 06/14/2014   CL 101 06/14/2014   CREATININE 1.00  06/14/2014   BUN 13 06/14/2014   CO2 24 06/14/2014   TSH 3.42 07/16/2014        Assessment & Plan:   Problem List Items Addressed This Visit    Endometriosis    Followed by Dr Greggory Keen.  Mason Jim.       Hypertension    Blood pressure rechecked as outlined.  Elevated.  Off amlodipine.  Needs to restart.  Follow pressures.  Follow metabolic panel.        Relevant Medications   spironolactone (ALDACTONE) 25 MG tablet   amLODipine (NORVASC) 10 MG tablet    Other Visit Diagnoses    Breast cancer screening    -  Primary   discussed [facility] - to schedule mammogram.  information given.        Dale DurhamSCOTT, Damond Borchers, MD

## 2017-05-13 LAB — HEPATIC FUNCTION PANEL
ALT: 20 (ref 7–35)
AST: 26 (ref 13–35)
Alkaline Phosphatase: 65 (ref 25–125)
Bilirubin, Total: 0.3

## 2017-05-13 LAB — BASIC METABOLIC PANEL
BUN: 12 (ref 4–21)
Creatinine: 0.9 (ref 0.5–1.1)
Glucose: 98
Potassium: 4.4 (ref 3.4–5.3)
Sodium: 139 (ref 137–147)

## 2017-05-13 LAB — LIPID PANEL
Cholesterol: 187 (ref 0–200)
HDL: 51 (ref 35–70)
LDL Cholesterol: 100
Triglycerides: 179 — AB (ref 40–160)

## 2017-05-14 ENCOUNTER — Encounter: Payer: Self-pay | Admitting: Internal Medicine

## 2017-05-14 NOTE — Assessment & Plan Note (Signed)
Followed by Dr Greggory KeeneFrancesco.  Mason JimStable.

## 2017-05-14 NOTE — Assessment & Plan Note (Signed)
Blood pressure rechecked as outlined.  Elevated.  Off amlodipine.  Needs to restart.  Follow pressures.  Follow metabolic panel.

## 2017-05-23 ENCOUNTER — Other Ambulatory Visit: Payer: Self-pay | Admitting: Obstetrics and Gynecology

## 2017-05-27 ENCOUNTER — Telehealth: Payer: Self-pay | Admitting: *Deleted

## 2017-05-27 NOTE — Telephone Encounter (Signed)
Patient states the pharmacy only gave her a 30 day refill instead of 90 day supply of her estradiol and progesterone . Patient is wondering if she can get a rx for 90 supply instead of 30 day supply she states it cost less . Patient pharmacy is [facility] on [address]. Please advise, Thank you

## 2017-05-28 NOTE — Telephone Encounter (Signed)
Pt aware per last office note she needed an ae in 2018. Pt did not come in.  Pt only given 30 day supply of meds until ae is scheduled. Pt states mad told her she only needed to see him if needed. Pt is self pay. Aware I will c confirm with mad. Advised pt that mad sees his pt yearly when they are on meds. Pt aware he will get this message on Monday.

## 2017-06-01 NOTE — Telephone Encounter (Signed)
Pt aware per vm she needs an AE.

## 2017-06-21 ENCOUNTER — Encounter: Payer: Self-pay | Admitting: Obstetrics and Gynecology

## 2017-06-21 ENCOUNTER — Ambulatory Visit (INDEPENDENT_AMBULATORY_CARE_PROVIDER_SITE_OTHER): Payer: Self-pay | Admitting: Obstetrics and Gynecology

## 2017-06-21 VITALS — BP 137/84 | HR 83 | Ht 64.5 in | Wt 178.0 lb

## 2017-06-21 DIAGNOSIS — E8940 Asymptomatic postprocedural ovarian failure: Secondary | ICD-10-CM

## 2017-06-21 DIAGNOSIS — L989 Disorder of the skin and subcutaneous tissue, unspecified: Secondary | ICD-10-CM | POA: Insufficient documentation

## 2017-06-21 DIAGNOSIS — Z8742 Personal history of other diseases of the female genital tract: Secondary | ICD-10-CM | POA: Insufficient documentation

## 2017-06-21 DIAGNOSIS — Z9079 Acquired absence of other genital organ(s): Secondary | ICD-10-CM

## 2017-06-21 DIAGNOSIS — Z90710 Acquired absence of both cervix and uterus: Secondary | ICD-10-CM

## 2017-06-21 DIAGNOSIS — Z01419 Encounter for gynecological examination (general) (routine) without abnormal findings: Secondary | ICD-10-CM

## 2017-06-21 DIAGNOSIS — Z90722 Acquired absence of ovaries, bilateral: Secondary | ICD-10-CM

## 2017-06-21 MED ORDER — ESTRADIOL 1 MG PO TABS: 1.0000 mg | ORAL_TABLET | Freq: Every day | ORAL | 3 refills | Status: DC

## 2017-06-21 MED ORDER — PROGESTERONE MICRONIZED 200 MG PO CAPS: 200.0000 mg | ORAL_CAPSULE | Freq: Every day | ORAL | 3 refills | Status: DC

## 2017-06-21 NOTE — Progress Notes (Signed)
ANNUAL PREVENTATIVE CARE GYN  ENCOUNTER NOTE  Subjective:       Tracy Ward is a 52 y.o. G0P0000 female here for a routine annual gynecologic exam.  Current complaints: 1.  None  Tracy Ward continues to be asymptomatic while on hormone replacement therapy for stage IV endometriosis-estradiol 1 mg a day and Prometrium 200 mg a day. Continuing to take progestin therapy helps to stabilize her moods and also facilitate sleep.  She is aware of the potential risk for slight increased breast cancer risk while on combined hormone replacement therapy. No significant change in bowel or bladder function. No significant major interval health issues.   Gynecologic History No LMP recorded. Patient has had a hysterectomy. Contraception: status post hysterectomy Last Pap: 2009 neg Last mammogram: 2013 wnl-  Results were: normal  Obstetric History OB History  Gravida Para Term Preterm AB Living  0 0 0 0 0 0  SAB TAB Ectopic Multiple Live Births  0 0 0 0          Past Medical History:  Diagnosis Date  . Endometriosis   . Hx of migraines   . Hypertension   . Seasonal allergies     Past Surgical History:  Procedure Laterality Date  . BREAST SURGERY  06/2004   augmentation  . LAPAROSCOPY  1999   Endometriosis  . LAPAROTOMY  1994   Endometriosis  . TONSILLECTOMY  1997   Partial  . TOTAL ABDOMINAL HYSTERECTOMY W/ BILATERAL SALPINGOOPHORECTOMY  1999    Current Outpatient Medications on File Prior to Visit  Medication Sig Dispense Refill  . amLODipine (NORVASC) 10 MG tablet Take 1 tablet (10 mg total) by mouth daily. 90 tablet 0  . cetirizine (ZYRTEC) 10 MG tablet Take 10 mg by mouth daily.    Marland Kitchen estradiol (ESTRACE) 1 MG tablet Take 1 tablet (1 mg total) by mouth daily. 90 tablet 0  . progesterone (PROMETRIUM) 200 MG capsule Take 1 capsule (200 mg total) by mouth at bedtime. 90 capsule 0  . spironolactone (ALDACTONE) 25 MG tablet Take 1 tablet (25 mg total) by mouth daily. 90 tablet 0    No current facility-administered medications on file prior to visit.     Allergies  Allergen Reactions  . Codeine Sulfate Itching  . Imitrex [Sumatriptan]   . Percocet [Oxycodone-Acetaminophen]     Social History   Socioeconomic History  . Marital status: Married    Spouse name: Not on file  . Number of children: 0  . Years of education: Not on file  . Highest education level: Not on file  Social Needs  . Financial resource strain: Not on file  . Food insecurity - worry: Not on file  . Food insecurity - inability: Not on file  . Transportation needs - medical: Not on file  . Transportation needs - non-medical: Not on file  Occupational History    Employer: [employer]  Tobacco Use  . Smoking status: Never Smoker  . Smokeless tobacco: Never Used  Substance and Sexual Activity  . Alcohol use: Yes    Alcohol/week: 0.0 oz  . Drug use: No  . Sexual activity: Yes    Birth control/protection: Surgical  Other Topics Concern  . Not on file  Social History Narrative  . Not on file    Family History  Problem Relation Age of Onset  . Arthritis Mother   . Hypertension Father   . Alcohol abuse Father   . Stroke Father   . Arthritis  Maternal Grandmother   . Heart disease Maternal Grandfather   . Hypertension Maternal Grandfather   . Cancer Neg Hx   . Colon cancer Neg Hx   . Ovarian cancer Neg Hx   . Breast cancer Neg Hx     The following portions of the patient's history were reviewed and updated as appropriate: allergies, current medications, past family history, past medical history, past social history, past surgical history and problem list.  Review of Systems Review of Systems  Constitutional: Negative.   HENT: Negative.   Eyes: Negative.   Respiratory: Negative.   Cardiovascular: Negative.   Gastrointestinal: Negative.   Genitourinary: Negative.   Musculoskeletal: Negative.   Neurological: Negative.   Endo/Heme/Allergies: Negative.    Psychiatric/Behavioral: Negative.      Objective:   BP 137/84   Pulse 83   Ht 5' 4.5" (1.638 m)   Wt 178 lb (80.7 kg)   BMI 30.08 kg/m  CONSTITUTIONAL: Well-developed, well-nourished female in no acute distress.  PSYCHIATRIC: Normal mood and affect. Normal behavior. Normal judgment and thought content. NEUROLGIC: Alert and oriented to person, place, and time. Normal muscle tone coordination. No cranial nerve deficit noted. HENT:  Normocephalic, atraumatic, External right and left ear normal. Oropharynx is clear and moist EYES: Conjunctivae and EOM are normal. No scleral icterus.  NECK: Normal range of motion, supple, no masses.  Normal thyroid.  SKIN: Skin is warm and dry. No rash noted. Not diaphoretic. No erythema. No pallor.  Multiple macules and papules, benign in appearance except for 1 black macule 7 mm diameter left upper abdomen CARDIOVASCULAR: Normal heart rate noted, regular rhythm, no murmur. RESPIRATORY: Clear to auscultation bilaterally. Effort and breath sounds normal, no problems with respiration noted. BREASTS: Symmetric in size. No masses, skin changes, nipple drainage, or lymphadenopathy. ABDOMEN: Soft, normal bowel sounds, no distention noted.  No tenderness, rebound or guarding.  BLADDER: Normal PELVIC:  External Genitalia: Slight epithelial skin breakdown along the right labia majora/inguinal fold without erythema or discharge  BUS: Normal  Vagina: Normal estrogen effect; good vault support  Cervix: Surgically absent  Uterus: Surgically absent  Adnexa: No palpable masses or tenderness  RV: External Exam NormaI, No Rectal Masses and Normal Sphincter tone  MUSCULOSKELETAL: Normal range of motion. No tenderness.  No cyanosis, clubbing, or edema.  2+ distal pulses. LYMPHATIC: No Axillary, Supraclavicular, or Inguinal Adenopathy.    Assessment:   Annual gynecologic examination 52 y.o. 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 Black macule, 7 mm, left upper abdomen  Plan:  Pap: Not needed Mammogram: scheduled this month Stool Guaiac Testing:  declines colonoscopy d/t cost- will check on stool cards Labs: thru pcp Routine preventative health maintenance measures emphasized: Exercise/Diet/Weight control, Tobacco Warnings and Alcohol/Substance use risks Estradiol 1 mg daily is refilled; Prometrium 200 mg a day is refilled Recommend dermatology evaluation for black macule left upper abdomen 7 mm Return to Clinic - 1 Year   Crystal Fort MyersMiller, CMA  Herold HarmsMartin A Jabarie Pop, MD  Note: This dictation was prepared with Dragon dictation along with smaller phrase technology. Any transcriptional errors that result from this process are unintentional.

## 2017-06-21 NOTE — Patient Instructions (Addendum)
1.  No Pap smear is done 2.  Mammogram is already scheduled 3.  Screening for colon cancer with stool cards as recommended-patient is to check lab sharing at work 4.  Continue with healthy eating and exercise 5.  Estradiol 1 mg a day is refilled; Prometrium 200 mg a day is refilled 6.  Calcium with vitamin D supplementation daily is recommended for colon cancer screening 7.  Return in 1 year for annual exam  Health Maintenance for Postmenopausal Women Menopause is a normal process in which your reproductive ability comes to an end. This process happens gradually over a span of months to years, usually between the ages of 50 and 66. Menopause is complete when you have missed 12 consecutive menstrual periods. It is important to talk with your health care provider about some of the most common conditions that affect postmenopausal women, such as heart disease, cancer, and bone loss (osteoporosis). Adopting a healthy lifestyle and getting preventive care can help to promote your health and wellness. Those actions can also lower your chances of developing some of these common conditions. What should I know about menopause? During menopause, you may experience a number of symptoms, such as:  Moderate-to-severe hot flashes.  Night sweats.  Decrease in sex drive.  Mood swings.  Headaches.  Tiredness.  Irritability.  Memory problems.  Insomnia.  Choosing to treat or not to treat menopausal changes is an individual decision that you make with your health care provider. What should I know about hormone replacement therapy and supplements? Hormone therapy products are effective for treating symptoms that are associated with menopause, such as hot flashes and night sweats. Hormone replacement carries certain risks, especially as you become older. If you are thinking about using estrogen or estrogen with progestin treatments, discuss the benefits and risks with your health care provider. What  should I know about heart disease and stroke? Heart disease, heart attack, and stroke become more likely as you age. This may be due, in part, to the hormonal changes that your body experiences during menopause. These can affect how your body processes dietary fats, triglycerides, and cholesterol. Heart attack and stroke are both medical emergencies. There are many things that you can do to help prevent heart disease and stroke:  Have your blood pressure checked at least every 1-2 years. High blood pressure causes heart disease and increases the risk of stroke.  If you are 36-36 years old, ask your health care provider if you should take aspirin to prevent a heart attack or a stroke.  Do not use any tobacco products, including cigarettes, chewing tobacco, or electronic cigarettes. If you need help quitting, ask your health care provider.  It is important to eat a healthy diet and maintain a healthy weight. ? Be sure to include plenty of vegetables, fruits, low-fat dairy products, and lean protein. ? Avoid eating foods that are high in solid fats, added sugars, or salt (sodium).  Get regular exercise. This is one of the most important things that you can do for your health. ? Try to exercise for at least 150 minutes each week. The type of exercise that you do should increase your heart rate and make you sweat. This is known as moderate-intensity exercise. ? Try to do strengthening exercises at least twice each week. Do these in addition to the moderate-intensity exercise.  Know your numbers.Ask your health care provider to check your cholesterol and your blood glucose. Continue to have your blood tested as directed by  your health care provider.  What should I know about cancer screening? There are several types of cancer. Take the following steps to reduce your risk and to catch any cancer development as early as possible. Breast Cancer  Practice breast self-awareness. ? This means  understanding how your breasts normally appear and feel. ? It also means doing regular breast self-exams. Let your health care provider know about any changes, no matter how small.  If you are 13 or older, have a clinician do a breast exam (clinical breast exam or CBE) every year. Depending on your age, family history, and medical history, it may be recommended that you also have a yearly breast X-ray (mammogram).  If you have a family history of breast cancer, talk with your health care provider about genetic screening.  If you are at high risk for breast cancer, talk with your health care provider about having an MRI and a mammogram every year.  Breast cancer (BRCA) gene test is recommended for women who have family members with BRCA-related cancers. Results of the assessment will determine the need for genetic counseling and BRCA1 and for BRCA2 testing. BRCA-related cancers include these types: ? Breast. This occurs in males or females. ? Ovarian. ? Tubal. This may also be called fallopian tube cancer. ? Cancer of the abdominal or pelvic lining (peritoneal cancer). ? Prostate. ? Pancreatic.  Cervical, Uterine, and Ovarian Cancer Your health care provider may recommend that you be screened regularly for cancer of the pelvic organs. These include your ovaries, uterus, and vagina. This screening involves a pelvic exam, which includes checking for microscopic changes to the surface of your cervix (Pap test).  For women ages 21-65, health care providers may recommend a pelvic exam and a Pap test every three years. For women ages 65-65, they may recommend the Pap test and pelvic exam, combined with testing for human papilloma virus (HPV), every five years. Some types of HPV increase your risk of cervical cancer. Testing for HPV may also be done on women of any age who have unclear Pap test results.  Other health care providers may not recommend any screening for nonpregnant women who are  considered low risk for pelvic cancer and have no symptoms. Ask your health care provider if a screening pelvic exam is right for you.  If you have had past treatment for cervical cancer or a condition that could lead to cancer, you need Pap tests and screening for cancer for at least 20 years after your treatment. If Pap tests have been discontinued for you, your risk factors (such as having a new sexual partner) need to be reassessed to determine if you should start having screenings again. Some women have medical problems that increase the chance of getting cervical cancer. In these cases, your health care provider may recommend that you have screening and Pap tests more often.  If you have a family history of uterine cancer or ovarian cancer, talk with your health care provider about genetic screening.  If you have vaginal bleeding after reaching menopause, tell your health care provider.  There are currently no reliable tests available to screen for ovarian cancer.  Lung Cancer Lung cancer screening is recommended for adults 59-11 years old who are at high risk for lung cancer because of a history of smoking. A yearly low-dose CT scan of the lungs is recommended if you:  Currently smoke.  Have a history of at least 30 pack-years of smoking and you currently smoke or  have quit within the past 15 years. A pack-year is smoking an average of one pack of cigarettes per day for one year.  Yearly screening should:  Continue until it has been 15 years since you quit.  Stop if you develop a health problem that would prevent you from having lung cancer treatment.  Colorectal Cancer  This type of cancer can be detected and can often be prevented.  Routine colorectal cancer screening usually begins at age 8 and continues through age 30.  If you have risk factors for colon cancer, your health care provider may recommend that you be screened at an earlier age.  If you have a family history of  colorectal cancer, talk with your health care provider about genetic screening.  Your health care provider may also recommend using home test kits to check for hidden blood in your stool.  A small camera at the end of a tube can be used to examine your colon directly (sigmoidoscopy or colonoscopy). This is done to check for the earliest forms of colorectal cancer.  Direct examination of the colon should be repeated every 5-10 years until age 27. However, if early forms of precancerous polyps or small growths are found or if you have a family history or genetic risk for colorectal cancer, you may need to be screened more often.  Skin Cancer  Check your skin from head to toe regularly.  Monitor any moles. Be sure to tell your health care provider: ? About any new moles or changes in moles, especially if there is a change in a mole's shape or color. ? If you have a mole that is larger than the size of a pencil eraser.  If any of your family members has a history of skin cancer, especially at a young age, talk with your health care provider about genetic screening.  Always use sunscreen. Apply sunscreen liberally and repeatedly throughout the day.  Whenever you are outside, protect yourself by wearing long sleeves, pants, a wide-brimmed hat, and sunglasses.  What should I know about osteoporosis? Osteoporosis is a condition in which bone destruction happens more quickly than new bone creation. After menopause, you may be at an increased risk for osteoporosis. To help prevent osteoporosis or the bone fractures that can happen because of osteoporosis, the following is recommended:  If you are 49-38 years old, get at least 1,000 mg of calcium and at least 600 mg of vitamin D per day.  If you are older than age 37 but younger than age 64, get at least 1,200 mg of calcium and at least 600 mg of vitamin D per day.  If you are older than age 101, get at least 1,200 mg of calcium and at least 800 mg  of vitamin D per day.  Smoking and excessive alcohol intake increase the risk of osteoporosis. Eat foods that are rich in calcium and vitamin D, and do weight-bearing exercises several times each week as directed by your health care provider. What should I know about how menopause affects my mental health? Depression may occur at any age, but it is more common as you become older. Common symptoms of depression include:  Low or sad mood.  Changes in sleep patterns.  Changes in appetite or eating patterns.  Feeling an overall lack of motivation or enjoyment of activities that you previously enjoyed.  Frequent crying spells.  Talk with your health care provider if you think that you are experiencing depression. What should I know  about immunizations? It is important that you get and maintain your immunizations. These include:  Tetanus, diphtheria, and pertussis (Tdap) booster vaccine.  Influenza every year before the flu season begins.  Pneumonia vaccine.  Shingles vaccine.  Your health care provider may also recommend other immunizations. This information is not intended to replace advice given to you by your health care provider. Make sure you discuss any questions you have with your health care provider. Document Released: 05/29/2005 Document Revised: 10/25/2015 Document Reviewed: 01/08/2015 Elsevier Interactive Patient Education  2018 [employer].

## 2017-07-14 ENCOUNTER — Ambulatory Visit: Payer: Self-pay | Attending: Oncology

## 2017-07-14 ENCOUNTER — Other Ambulatory Visit: Payer: Self-pay

## 2017-07-14 ENCOUNTER — Ambulatory Visit
Admission: RE | Admit: 2017-07-14 | Discharge: 2017-07-14 | Disposition: A | Discharge status: Home / Self Care | Payer: Self-pay | Source: Ambulatory Visit | Attending: Oncology | Admitting: Oncology

## 2017-07-14 VITALS — BP 138/87 | HR 81 | Temp 98.7°F | Ht 65.0 in | Wt 180.0 lb

## 2017-07-14 DIAGNOSIS — Z0000 Encounter for general adult medical examination without abnormal findings: Secondary | ICD-10-CM

## 2017-07-14 NOTE — Progress Notes (Signed)
Subjective:     Patient ID: Tracy Ward, female   DOB: [birth year], 52 y.o.   MRN: [id number]  HPI   Review of Systems     Objective:   Physical Exam  Pulmonary/Chest: Right breast exhibits no inverted nipple, no mass, no nipple discharge, no skin change and no tenderness. Left breast exhibits no inverted nipple, no mass, no nipple discharge, no skin change and no tenderness. Breasts are symmetrical.    Bilateral breast implants       Assessment:     52 year old patient presents for [facility] clinic visit.  Patient screened, and meets [facility] eligibility.  Patient does not have insurance, Medicare or Medicaid.  Handout given on Affordable Care Act.  Instructed patient on breast self awareness using teach back method.  CBE unremarkable.  Patient has bilateral implants.  No mass or lump palpated.    Plan:     Sent for bilateral screening mammogram.

## 2017-07-15 ENCOUNTER — Other Ambulatory Visit: Payer: Self-pay

## 2017-07-15 DIAGNOSIS — Z0000 Encounter for general adult medical examination without abnormal findings: Secondary | ICD-10-CM

## 2017-07-15 NOTE — Progress Notes (Signed)
Letter mailed from [facility] to notify of normal mammogram results.  Patient to return in one year for annual screening.  Copy to [facility]. 

## 2017-07-27 ENCOUNTER — Encounter: Payer: Self-pay | Admitting: Internal Medicine

## 2017-08-10 ENCOUNTER — Ambulatory Visit: Payer: Self-pay | Admitting: Primary Care

## 2017-08-10 ENCOUNTER — Encounter: Payer: Self-pay | Admitting: Primary Care

## 2017-08-10 VITALS — BP 130/82 | HR 91 | Temp 98.6°F | Ht 64.5 in | Wt 180.2 lb

## 2017-08-10 DIAGNOSIS — J029 Acute pharyngitis, unspecified: Secondary | ICD-10-CM

## 2017-08-10 DIAGNOSIS — J069 Acute upper respiratory infection, unspecified: Secondary | ICD-10-CM

## 2017-08-10 MED ORDER — METHYLPREDNISOLONE ACETATE 40 MG/ML IJ SUSP
80.0000 mg | Freq: Once | INTRAMUSCULAR | Status: AC
  Administered 2017-08-10: 80 mg via INTRAMUSCULAR

## 2017-08-10 NOTE — Patient Instructions (Signed)
Your symptoms are representative of a viral illness which will resolve on its own over time. Our goal is to treat your symptoms in order to aid your body in the healing process and to make you more comfortable.   You were provided with an injection of Depo Medrol steroids to help reduce inflammation and pain to the throat and inflammation of the lungs.  Please call me Monday next week if you start to feel worse and have not improved, and/or call me sooner if you start running fevers of 101 or higher.  You can try Delsym or Robitussin for cough.   Make sure to stay hydrated with water and rest.   It was a pleasure meeting you!

## 2017-08-10 NOTE — Progress Notes (Signed)
Subjective:    Patient ID: Tracy Ward, female    DOB: 09/04/1965, 52 y.o.   MRN: [id number]  HPI  Tracy Ward is a 52 year old female with a history of allergic rhinitis, hypertension who presents today with a chief complaint of sore throat.   She also reports low grade fever (99.5 this morning), chills, hoarse voice, throat congestion. She's been blowing clear mucous from her nasal cavity. Her symptoms began four days ago. She's been taking Dayquil, other OTC cough suppressant, Zyrtec. Her most bothersome symptom is sore throat.   Review of Systems  Constitutional: Positive for chills and fever.  HENT: Positive for congestion, postnasal drip, sore throat and voice change. Negative for ear pain and sinus pressure.   Respiratory: Positive for cough. Negative for shortness of breath and wheezing.         Past Medical History:  Diagnosis Date  . Endometriosis   . Hx of migraines   . Hypertension   . Seasonal allergies   . Shoulder fracture      Social History   Socioeconomic History  . Marital status: Married    Spouse name: Not on file  . Number of children: 0  . Years of education: Not on file  . Highest education level: Not on file  Occupational History    Employer: [employer]  Social Needs  . Financial resource strain: Not on file  . Food insecurity:    Worry: Not on file    Inability: Not on file  . Transportation needs:    Medical: Not on file    Non-medical: Not on file  Tobacco Use  . Smoking status: Never Smoker  . Smokeless tobacco: Never Used  Substance and Sexual Activity  . Alcohol use: Yes    Alcohol/week: 0.0 oz    Comment: social  . Drug use: No  . Sexual activity: Yes    Birth control/protection: Surgical  Lifestyle  . Physical activity:    Days per week: 2 days    Minutes per session: 30 min  . Stress: Not on file  Relationships  . Social connections:    Talks on phone: Not on file    Gets together: Not on file   Attends religious service: Not on file    Active member of club or organization: Not on file    Attends meetings of clubs or organizations: Not on file    Relationship status: Not on file  . Intimate partner violence:    Fear of current or ex partner: Not on file    Emotionally abused: Not on file    Physically abused: Not on file    Forced sexual activity: Not on file  Other Topics Concern  . Not on file  Social History Narrative  . Not on file    Past Surgical History:  Procedure Laterality Date  . BREAST SURGERY  06/2004   augmentation  . LAPAROSCOPY  1999   Endometriosis  . LAPAROTOMY  1994   Endometriosis  . TONSILLECTOMY  1997   Partial  . TOTAL ABDOMINAL HYSTERECTOMY W/ BILATERAL SALPINGOOPHORECTOMY  1999    Family History  Problem Relation Age of Onset  . Arthritis Mother   . Hypertension Father   . Alcohol abuse Father   . Stroke Father   . Arthritis Maternal Grandmother   . Heart disease Maternal Grandfather   . Hypertension Maternal Grandfather   . Cancer Neg Hx   . Colon cancer Neg Hx   .  Ovarian cancer Neg Hx   . Breast cancer Neg Hx   . Diabetes Neg Hx     Allergies  Allergen Reactions  . Codeine Sulfate Itching  . Imitrex [Sumatriptan]   . Percocet [Oxycodone-Acetaminophen]     Current Outpatient Medications on File Prior to Visit  Medication Sig Dispense Refill  . amLODipine (NORVASC) 10 MG tablet Take 1 tablet (10 mg total) by mouth daily. 90 tablet 0  . cetirizine (ZYRTEC) 10 MG tablet Take 10 mg by mouth daily.    Marland Kitchen estradiol (ESTRACE) 1 MG tablet Take 1 tablet (1 mg total) by mouth daily. 90 tablet 3  . progesterone (PROMETRIUM) 200 MG capsule Take 1 capsule (200 mg total) by mouth at bedtime. 90 capsule 3  . spironolactone (ALDACTONE) 25 MG tablet Take 1 tablet (25 mg total) by mouth daily. 90 tablet 0   No current facility-administered medications on file prior to visit.     BP 130/82   Pulse 91   Temp 98.6 F (37 C) (Oral)   Ht  5' 4.5" (1.638 m)   Wt 180 lb 4 oz (81.8 kg)   SpO2 96%   BMI 30.46 kg/m    Objective:   Physical Exam  Constitutional: She appears well-nourished.  Dry cough during exam  HENT:  Right Ear: Tympanic membrane and ear canal normal.  Left Ear: Tympanic membrane and ear canal normal.  Nose: Right sinus exhibits no maxillary sinus tenderness and no frontal sinus tenderness. Left sinus exhibits no maxillary sinus tenderness and no frontal sinus tenderness.  Mouth/Throat: Oropharynx is clear and moist.  Eyes: Conjunctivae are normal.  Neck: Neck supple.  Cardiovascular: Normal rate and regular rhythm.  Pulmonary/Chest: Effort normal. She has no decreased breath sounds. She has no wheezes. She has no rhonchi. She has no rales.  Tighter sounding airways  Lymphadenopathy:    She has no cervical adenopathy.  Skin: Skin is warm and dry.          Assessment & Plan:  URI Vs Allergies:  Sore throat, cough, congestion x 4 days. Exam today with clear lungs, normal vitals. Suspect viral vs allergy involvement and will treat with conservative measures. IM Depo Medrol provided for inflammation to throat and perhaps to airways causing overall decrease in movement.  Discussed to continue OTC cough medication and Zyrtec. Strict call in precautions were provided for her to use if symptoms progressed/do not improve.   Doreene Nest, NP

## 2017-08-12 ENCOUNTER — Ambulatory Visit: Payer: Self-pay | Admitting: Internal Medicine

## 2017-09-19 ENCOUNTER — Other Ambulatory Visit: Payer: Self-pay | Admitting: Internal Medicine

## 2017-12-12 ENCOUNTER — Other Ambulatory Visit: Payer: Self-pay | Admitting: Internal Medicine

## 2018-03-03 ENCOUNTER — Other Ambulatory Visit: Payer: Self-pay | Admitting: Internal Medicine

## 2018-03-10 ENCOUNTER — Telehealth: Payer: Self-pay | Admitting: Obstetrics and Gynecology

## 2018-03-10 NOTE — Telephone Encounter (Signed)
The patient has some questions about her medication, please advise, thanks.

## 2018-03-11 MED ORDER — ESTRADIOL 1 MG PO TABS: 1.0000 mg | ORAL_TABLET | Freq: Every day | ORAL | 0 refills | Status: DC

## 2018-03-11 MED ORDER — PROGESTERONE MICRONIZED 200 MG PO CAPS: 200.0000 mg | ORAL_CAPSULE | Freq: Every day | ORAL | 0 refills | Status: DC

## 2018-03-11 NOTE — Telephone Encounter (Signed)
Spoke with patient ans she is concerned that she does not have enough refills to last until she decides who she would like to see after Dr. Greggory Keenefrancesco leaves. She stated that the pharmacy does not have any refills left at the pharmacy. I told patient I would send her another 90 day supply to the pharmacy.

## 2018-06-02 ENCOUNTER — Other Ambulatory Visit: Payer: Self-pay | Admitting: Internal Medicine

## 2018-06-02 MED ORDER — AMLODIPINE BESYLATE 10 MG PO TABS: 10.0000 mg | ORAL_TABLET | Freq: Every day | ORAL | 0 refills | Status: DC

## 2018-06-02 MED ORDER — SPIRONOLACTONE 25 MG PO TABS: 25.0000 mg | ORAL_TABLET | Freq: Every day | ORAL | 0 refills | Status: DC

## 2018-06-02 NOTE — Telephone Encounter (Unsigned)
Copied from CRM [id number]. Topic: Quick Communication - Rx Refill/Question >> Jun 02, 2018  4:35 PM Arlyss Gandy, NT wrote: Medication: spironolactone (ALDACTONE) 25 MG tablet and amLODipine (NORVASC) 10 MG tablet 90 day rx  Has the patient contacted their pharmacy? Yes.   (Agent: If no, request that the patient contact the pharmacy for the refill.) (Agent: If yes, when and what did the pharmacy advise?)  Preferred Pharmacy (with phone number or street name): [facility] [address] - [place], [place] - [phone] (Phone) [phone] (Fax)    Agent: Please be advised that RX refills may take up to 3 business days. We ask that you follow-up with your pharmacy.

## 2018-06-02 NOTE — Telephone Encounter (Signed)
Left message for pt to return call to office to schedule appt in order to receive refills of requested medication.

## 2018-06-02 NOTE — Telephone Encounter (Signed)
Courtesy refill until appointment 09/20/18.

## 2018-06-02 NOTE — Telephone Encounter (Signed)
Copied from CRM [id number]. Topic: Quick Communication - Rx Refill/Question >> Jun 02, 2018  5:32 PM Jilda Roche wrote: Medication: spironolactone (ALDACTONE) 25 MG tablet, amLODipine (NORVASC) 10 MG tablet  Has the patient contacted their pharmacy? Yes (Agent: If no, request that the patient contact the pharmacy for the refill.) (Agent: If yes, when and what did the pharmacy advise?) Call office and make appt, patient is scheduled for 6/20  Preferred Pharmacy (with phone number or street name): [facility] [address] - [place], [place] - [address] [phone] (Phone) [phone] (Fax)    Agent: Please be advised that RX refills may take up to 3 business days. We ask that you follow-up with your pharmacy.

## 2018-06-23 ENCOUNTER — Encounter: Payer: Self-pay | Admitting: Obstetrics and Gynecology

## 2018-09-11 ENCOUNTER — Other Ambulatory Visit: Payer: Self-pay | Admitting: Internal Medicine

## 2018-09-14 ENCOUNTER — Other Ambulatory Visit: Payer: Self-pay | Admitting: *Deleted

## 2018-09-14 MED ORDER — PROGESTERONE MICRONIZED 200 MG PO CAPS: 200.0000 mg | ORAL_CAPSULE | Freq: Every day | ORAL | 0 refills | Status: DC

## 2018-09-20 ENCOUNTER — Ambulatory Visit: Payer: Self-pay | Admitting: Internal Medicine

## 2018-09-30 ENCOUNTER — Encounter: Payer: Self-pay | Admitting: Internal Medicine

## 2018-09-30 ENCOUNTER — Other Ambulatory Visit: Payer: Self-pay

## 2018-09-30 ENCOUNTER — Ambulatory Visit (INDEPENDENT_AMBULATORY_CARE_PROVIDER_SITE_OTHER): Payer: Self-pay | Admitting: Internal Medicine

## 2018-09-30 DIAGNOSIS — Z1211 Encounter for screening for malignant neoplasm of colon: Secondary | ICD-10-CM

## 2018-09-30 DIAGNOSIS — Z1239 Encounter for other screening for malignant neoplasm of breast: Secondary | ICD-10-CM

## 2018-09-30 DIAGNOSIS — N809 Endometriosis, unspecified: Secondary | ICD-10-CM

## 2018-09-30 DIAGNOSIS — I10 Essential (primary) hypertension: Secondary | ICD-10-CM

## 2018-09-30 NOTE — Progress Notes (Signed)
Patient ID: Tracy Ward, female   DOB: Aug 24, 1965, 53 y.o.   MRN: [id number]   Virtual Visit via video Note  This visit type was conducted due to national recommendations for restrictions regarding the COVID-19 pandemic (e.g. social distancing).  This format is felt to be most appropriate for this patient at this time.  All issues noted in this document were discussed and addressed.  No physical exam was performed (except for noted visual exam findings with Video Visits).   I connected with Tracy Ward by a video enabled telemedicine application or telephone and verified that I am speaking with the correct person using two identifiers. Location patient: work Location provider: work  Persons participating in the virtual visit: patient, provider  I discussed the limitations, risks, security and privacy concerns of performing an evaluation and management service by video and the availability of in person appointments.. The patient expressed understanding and agreed to proceed.   Reason for visit: scheduled follow up.   HPI: I last saw her 04/2017.  She has previously seen Tracy Ward for gyn exams.  He is retired now.  Discussed the need to f/u with gyn.  She request a refill on her medication until she can get an appt.  Also discussed with her regarding need for labs.  She will get these labs through her work.  She is working from home.  Handling stress.  Trying to stay in as much as possible due to covid restrictions.  No fever. No chest congestion, cough or sob.  No acid reflux.  No abdominal pain.  Bowels moving.     ROS: See pertinent positives and negatives per HPI.  Past Medical History:  Diagnosis Date  . Endometriosis   . Hx of migraines   . Hypertension   . Seasonal allergies   . Shoulder fracture     Past Surgical History:  Procedure Laterality Date  . BREAST SURGERY  06/2004   augmentation  . LAPAROSCOPY  1999   Endometriosis  . LAPAROTOMY  1994   Endometriosis   . TONSILLECTOMY  1997   Partial  . TOTAL ABDOMINAL HYSTERECTOMY W/ BILATERAL SALPINGOOPHORECTOMY  1999    Family History  Problem Relation Age of Onset  . Arthritis Mother   . Hypertension Father   . Alcohol abuse Father   . Stroke Father   . Arthritis Maternal Grandmother   . Heart disease Maternal Grandfather   . Hypertension Maternal Grandfather   . Cancer Neg Hx   . Colon cancer Neg Hx   . Ovarian cancer Neg Hx   . Breast cancer Neg Hx   . Diabetes Neg Hx     SOCIAL HX: reviewed.    Current Outpatient Medications:  .  amLODipine (NORVASC) 10 MG tablet, TAKE ONE TABLET BY MOUTH DAILY *MUST KEEP 09/20/18 APPOINTMENT FOR ADDITIONAL REFILLS*, Disp: 30 tablet, Rfl: 0 .  cetirizine (ZYRTEC) 10 MG tablet, Take 10 mg by mouth daily., Disp: , Rfl:  .  estradiol (ESTRACE) 1 MG tablet, Take 1 tablet (1 mg total) by mouth daily., Disp: 90 tablet, Rfl: 0 .  progesterone (PROMETRIUM) 200 MG capsule, Take 1 capsule (200 mg total) by mouth at bedtime., Disp: 90 capsule, Rfl: 0 .  spironolactone (ALDACTONE) 25 MG tablet, TAKE ONE TABLET BY MOUTH DAILY *MUST KEEP 09/20/18 APPOINTMENT FOR ADDITIONAL REFILLS*, Disp: 30 tablet, Rfl: 0  EXAM:  GENERAL: alert, oriented, appears well and in no acute distress  HEENT: atraumatic, conjunttiva clear, no obvious abnormalities on inspection  of external nose and ears  NECK: normal movements of the head and neck  LUNGS: on inspection no signs of respiratory distress, breathing rate appears normal, no obvious gross SOB, gasping or wheezing  CV: no obvious cyanosis  PSYCH/NEURO: pleasant and cooperative, no obvious depression or anxiety, speech and thought processing grossly intact  ASSESSMENT AND PLAN:  Discussed the following assessment and plan:  Endometriosis Followed by Tracy Tracy Ward.  On estrogen and progesterone.  Discussed the need for f/u with gyn for more refills.  She request refills today until can get her f/u with gyn.  Discussed  importance of f/u.  Discussed would refill x 1 until she can get her f/u appt.  Also discussed my desire (given her history of elevated blood pressure), discussed would like to eventually get her off hormones.    Hypertension Reports blood pressure is under good control.  Discussed the need for labs.  Discussed she needed to keep regular f/u appts.  Will refill x 1, but explained needs to f/u as outlined for more refills.  She will get labs at work.     Colon cancer screening Discussed the need for colon cancer screening.  She declines at this time.      I discussed the assessment and treatment plan with the patient. The patient was provided an opportunity to ask questions and all were answered. The patient agreed with the plan and demonstrated an understanding of the instructions.   The patient was advised to call back or seek an in-person evaluation if the symptoms worsen or if the condition fails to improve as anticipated.    Einar Pheasant, MD

## 2018-10-03 ENCOUNTER — Encounter: Payer: Self-pay | Admitting: Internal Medicine

## 2018-10-03 DIAGNOSIS — Z1211 Encounter for screening for malignant neoplasm of colon: Secondary | ICD-10-CM | POA: Insufficient documentation

## 2018-10-03 DIAGNOSIS — Z1239 Encounter for other screening for malignant neoplasm of breast: Secondary | ICD-10-CM | POA: Insufficient documentation

## 2018-10-03 NOTE — Assessment & Plan Note (Signed)
Discussed the need for colon cancer screening.  She declines at this time.

## 2018-10-03 NOTE — Assessment & Plan Note (Signed)
Followed by Dr Enzo Bi.  On estrogen and progesterone.  Discussed the need for f/u with gyn for more refills.  She request refills today until can get her f/u with gyn.  Discussed importance of f/u.  Discussed would refill x 1 until she can get her f/u appt.  Also discussed my desire (given her history of elevated blood pressure), discussed would like to eventually get her off hormones.

## 2018-10-03 NOTE — Assessment & Plan Note (Signed)
Reports blood pressure is under good control.  Discussed the need for labs.  Discussed she needed to keep regular f/u appts.  Will refill x 1, but explained needs to f/u as outlined for more refills.  She will get labs at work.

## 2018-10-10 LAB — CBC AND DIFFERENTIAL
HCT: 44 (ref 36–46)
Hemoglobin: 14.4 (ref 12.0–16.0)
Platelets: 326 (ref 150–399)
WBC: 5.5

## 2018-10-10 LAB — LIPID PANEL
Cholesterol: 217 — AB (ref 0–200)
HDL: 54 (ref 35–70)
LDL Cholesterol: 131
Triglycerides: 185 — AB (ref 40–160)

## 2018-10-10 LAB — BASIC METABOLIC PANEL
BUN: 14 (ref 4–21)
Creatinine: 0.9 (ref 0.5–1.1)
Glucose: 87
Potassium: 4.3 (ref 3.4–5.3)
Sodium: 138 (ref 137–147)

## 2018-10-10 LAB — HEPATIC FUNCTION PANEL
ALT: 19 (ref 7–35)
AST: 25 (ref 13–35)
Alkaline Phosphatase: 66 (ref 25–125)
Bilirubin, Total: 0.4

## 2018-10-10 LAB — TSH: TSH: 2.95 (ref 0.41–5.90)

## 2018-10-12 ENCOUNTER — Ambulatory Visit: Payer: Self-pay | Admitting: Obstetrics and Gynecology

## 2018-10-12 ENCOUNTER — Encounter: Payer: Self-pay | Admitting: Internal Medicine

## 2018-10-12 ENCOUNTER — Other Ambulatory Visit: Payer: Self-pay

## 2018-10-12 ENCOUNTER — Encounter: Payer: Self-pay | Admitting: Obstetrics and Gynecology

## 2018-10-12 VITALS — BP 142/84 | HR 87 | Ht 65.0 in | Wt 186.7 lb

## 2018-10-12 DIAGNOSIS — Z79890 Hormone replacement therapy (postmenopausal): Secondary | ICD-10-CM

## 2018-10-12 MED ORDER — PROGESTERONE MICRONIZED 100 MG PO CAPS: 100.0000 mg | ORAL_CAPSULE | Freq: Every day | ORAL | 0 refills | Status: DC

## 2018-10-12 MED ORDER — ESTRADIOL 1 MG PO TABS: 1.0000 mg | ORAL_TABLET | Freq: Every day | ORAL | 0 refills | Status: DC

## 2018-10-12 NOTE — Progress Notes (Signed)
HPI:      [name] is a 53 y.o. G0P0000 who LMP was No LMP recorded. Patient has had a hysterectomy.  Subjective:   She presents today because she is on HRT and would like to discuss this.  She had a hysterectomy during her 67s for severe endometriosis.  She was immediately begun on menopausal doses of hormone replacement therapy which she has been taking ever since.  She has an idea that the progesterone may not be necessary without a uterus but has a sense that it may be controlling her "moods" and helping her sleep. She would like to continue HRT.    Hx: The following portions of the patient's history were reviewed and updated as appropriate:             She  has a past medical history of Endometriosis, migraines, Hypertension, Seasonal allergies, and Shoulder fracture. She does not have any pertinent problems on file. She  has a past surgical history that includes laparotomy (1994); laparoscopy (1999); Tonsillectomy (1997); Breast surgery (06/2004); and Total abdominal hysterectomy w/ bilateral salpingoophorectomy (1999). Her family history includes Alcohol abuse in her father; Arthritis in her maternal grandmother and mother; Heart disease in her maternal grandfather; Hypertension in her father and maternal grandfather; Stroke in her father. She  reports that she has never smoked. She has never used smokeless tobacco. She reports current alcohol use. She reports that she does not use drugs. She has a current medication list which includes the following prescription(s): amlodipine, cetirizine, estradiol, progesterone, and spironolactone. She is allergic to codeine sulfate; imitrex [sumatriptan]; and percocet [oxycodone-acetaminophen].       Review of Systems:  Review of Systems  Constitutional: Denied constitutional symptoms, night sweats, recent illness, fatigue, fever, insomnia and weight loss.  Eyes: Denied eye symptoms, eye pain, photophobia, vision change and visual  disturbance.  Ears/Nose/Throat/Neck: Denied ear, nose, throat or neck symptoms, hearing loss, nasal discharge, sinus congestion and sore throat.  Cardiovascular: Denied cardiovascular symptoms, arrhythmia, chest pain/pressure, edema, exercise intolerance, orthopnea and palpitations.  Respiratory: Denied pulmonary symptoms, asthma, pleuritic pain, productive sputum, cough, dyspnea and wheezing.  Gastrointestinal: Denied, gastro-esophageal reflux, melena, nausea and vomiting.  Genitourinary: Denied genitourinary symptoms including symptomatic vaginal discharge, pelvic relaxation issues, and urinary complaints.  Musculoskeletal: Denied musculoskeletal symptoms, stiffness, swelling, muscle weakness and myalgia.  Dermatologic: Denied dermatology symptoms, rash and scar.  Neurologic: Denied neurology symptoms, dizziness, headache, neck pain and syncope.  Psychiatric: Denied psychiatric symptoms, anxiety and depression.  Endocrine: Denied endocrine symptoms including hot flashes and night sweats.   Meds:   Current Outpatient Medications on File Prior to Visit  Medication Sig Dispense Refill  . amLODipine (NORVASC) 10 MG tablet TAKE ONE TABLET BY MOUTH DAILY *MUST KEEP 09/20/18 APPOINTMENT FOR ADDITIONAL REFILLS* 30 tablet 0  . cetirizine (ZYRTEC) 10 MG tablet Take 10 mg by mouth daily.    Marland Kitchen spironolactone (ALDACTONE) 25 MG tablet TAKE ONE TABLET BY MOUTH DAILY *MUST KEEP 09/20/18 APPOINTMENT FOR ADDITIONAL REFILLS* 30 tablet 0   No current facility-administered medications on file prior to visit.     Objective:     Vitals:   10/12/18 1436  BP: (!) 142/84  Pulse: 87                Assessment:    G0P0000 Patient Active Problem List   Diagnosis Date Noted  . Breast cancer screening 10/03/2018  . Colon cancer screening 10/03/2018  . Status post TAH-BSO 06/21/2017  . History  of endometriosis 06/21/2017  . Skin lesion 06/21/2017  . Herpes zoster 08/20/2016  . Chronic pelvic pain in female  07/21/2014  . Decreased libido 07/21/2014  . Coitalgia 07/21/2014  . History of migraine headaches 07/21/2014  . Adult hypothyroidism 07/21/2014  . Increased BMI 07/21/2014  . Osteopenia 07/21/2014  . Allergic rhinitis, seasonal 07/21/2014  . Postsurgical menopause 07/21/2014  . Dryness of vagina 07/21/2014  . Health care maintenance 06/17/2014  . Hypertension 09/18/2012  . Endometriosis 09/18/2012     1. Postmenopausal hormone therapy     Patient doing well at current dosage although progesterone may not be necessary.  Patient does not have a uterus.   Plan:            1.  HRT I have discussed HRT with the patient in detail.  The risk/benefits of it were reviewed.  She understands that during menopause Estrogen decreases dramatically and that this results in an increased risk of cardiovascular disease as well as osteoporosis.  We have also discussed the fact that hot flashes often result from a decrease in Estrogen, and that by replacing Estrogen, they can often be alleviated.  We have discussed skin, vaginal and urinary tract changes that may also take place from this drop in Estrogen.  Emotional changes have also been linked to Estrogen and we have briefly discussed this.  The benefits of HRT including decrease in hot flashes, vaginal dryness, and osteoporosis were discussed.  The emotional benefit and a possible change in her cardiovascular risk profile was also reviewed.  The risks associated with Hormone Replacement Therapy were also reviewed.  The use of unopposed Estrogen and its relationship to endometrial cancer was discussed.  The addition of Progesterone and its beneficial effect on endometrial cancer was also noted.  The fact that there has been no consistent definitive studies showing an increase in breast cancer in women who use HRT was discussed with the patient.  The possible side effects including breast tenderness, fluid retention, mood changes and vaginal bleeding were  discussed.  The patient was informed that this is an elective medication and that she may choose not to take Hormone Replacement Therapy.  Literature on HRT was given, and I believe that after answering all of the patient's questions, she has an adequate and informed understanding of HRT.  Special emphasis on the WHI study, as well as several studies since that pertaining to the risks and benefits of estrogen replacement therapy were compared.  The possible limitations of these studies were discussed including the age stratification of the WHI study.  The possible role of Progesterone in these studies was discussed in detail.  I believe that the patient has an informed knowledge of the risks and benefits of HRT.  I have specifically discussed WHI findings and current updates.  Different type of hormone formulation and methods of taking hormone replacement therapy discussed.  We have specifically discussed stopping the progesterone and the patient has proposed that she take 100 mg instead of 200 as a "trial".  I have approved this. She had multiple other questions regarding cardiovascular disease and HRT as well as bone loss and HRT.  I have addressed these each individually.  Orders No orders of the defined types were placed in this encounter.    Meds ordered this encounter  Medications  . estradiol (ESTRACE) 1 MG tablet    Sig: Take 1 tablet (1 mg total) by mouth daily.    Dispense:  90 tablet  Refill:  0  . progesterone (PROMETRIUM) 100 MG capsule    Sig: Take 1 capsule (100 mg total) by mouth at bedtime.    Dispense:  90 capsule    Refill:  0      F/U  No follow-ups on file. I spent 28 minutes involved in the care of this patient of which greater than 50% was spent discussing HRT use of estrogen and progesterone with and without hysterectomy, bone loss heart disease stroke cancer risk on HRT.  Prior endometriosis history.  Many questions answered.  Elonda Huskyavid J. Evans, M.D. 10/12/2018 3:26  PM

## 2018-10-19 ENCOUNTER — Telehealth: Payer: Self-pay

## 2018-10-19 NOTE — Telephone Encounter (Signed)
Pt notified of labs via my chart.  

## 2018-10-19 NOTE — Telephone Encounter (Signed)
Labs received and abstracted. Placed in folder for signature.

## 2018-10-23 ENCOUNTER — Other Ambulatory Visit: Payer: Self-pay | Admitting: Internal Medicine

## 2018-11-14 ENCOUNTER — Telehealth: Payer: Self-pay | Admitting: Internal Medicine

## 2018-11-14 NOTE — Telephone Encounter (Signed)
Patient request refills to be 90 day.  Please see phone note.

## 2018-11-14 NOTE — Telephone Encounter (Signed)
Last appt with Dr. Nicki Reaper was 6/12 talked about refills, was on 30 day due to visit pushed out with covid pls do 90 days again if possibleamLODipine (NORVASC) 10 MG tabletspironolactone (ALDACTONE) 25 MG tablet  [place], [place] [phone] (Phone) [phone] (Fax)

## 2018-11-14 NOTE — Telephone Encounter (Signed)
amLODipine (NORVASC) 10 MG tablet  spironolactone (ALDACTONE) 25 MG tablet  [place], [place] [phone] (Phone) [phone] (Fax)   Last appt was 6/12/ please put refills back on 90 day as before covid messed up her appts

## 2018-11-15 ENCOUNTER — Other Ambulatory Visit: Payer: Self-pay

## 2018-11-15 MED ORDER — SPIRONOLACTONE 25 MG PO TABS: ORAL_TABLET | ORAL | 2 refills | Status: DC

## 2018-11-15 MED ORDER — AMLODIPINE BESYLATE 10 MG PO TABS: ORAL_TABLET | ORAL | 2 refills | Status: DC

## 2018-11-15 NOTE — Telephone Encounter (Signed)
Pt returning call. Please advise. °

## 2018-11-15 NOTE — Telephone Encounter (Signed)
Pt aware.

## 2018-11-15 NOTE — Telephone Encounter (Signed)
Left message for patient

## 2019-01-22 ENCOUNTER — Other Ambulatory Visit: Payer: Self-pay | Admitting: Obstetrics and Gynecology

## 2019-01-22 DIAGNOSIS — Z79890 Hormone replacement therapy (postmenopausal): Secondary | ICD-10-CM

## 2019-02-14 ENCOUNTER — Ambulatory Visit (INDEPENDENT_AMBULATORY_CARE_PROVIDER_SITE_OTHER): Payer: Self-pay | Admitting: Internal Medicine

## 2019-02-14 ENCOUNTER — Other Ambulatory Visit: Payer: Self-pay

## 2019-02-14 VITALS — Ht 65.0 in | Wt 178.6 lb

## 2019-02-14 DIAGNOSIS — R638 Other symptoms and signs concerning food and fluid intake: Secondary | ICD-10-CM

## 2019-02-14 DIAGNOSIS — Z90722 Acquired absence of ovaries, bilateral: Secondary | ICD-10-CM

## 2019-02-14 DIAGNOSIS — Z9079 Acquired absence of other genital organ(s): Secondary | ICD-10-CM

## 2019-02-14 DIAGNOSIS — I10 Essential (primary) hypertension: Secondary | ICD-10-CM

## 2019-02-14 DIAGNOSIS — Z1231 Encounter for screening mammogram for malignant neoplasm of breast: Secondary | ICD-10-CM

## 2019-02-14 DIAGNOSIS — Z90710 Acquired absence of both cervix and uterus: Secondary | ICD-10-CM

## 2019-02-14 NOTE — Progress Notes (Signed)
Patient ID: Tracy Ward, female   DOB: 08-May-1965, 53 y.o.   MRN: [id number]   Virtual Visit via video Note  This visit type was conducted due to national recommendations for restrictions regarding the COVID-19 pandemic (e.g. social distancing).  This format is felt to be most appropriate for this patient at this time.  All issues noted in this document were discussed and addressed.  No physical exam was performed (except for noted visual exam findings with Video Visits).   I connected with Tracy Ward by a video enabled telemedicine application and verified that I am speaking with the correct person using two identifiers. Location patient: home Location provider: work Persons participating in the virtual visit: patient, provider  I discussed the limitations, risks, security and privacy concerns of performing an evaluation and management service by video and the availability of in person appointments.  The patient expressed understanding and agreed to proceed.   Reason for visit: scheduled follow up.   HPI: She reports she is doing well. Handling stress.  Not traveling with her job.  Has been watching her diet.  Exercising. Has lost weight.  Lost 10 pounds.  No chest pain.  No sob.  No acid reflux.  No abdominal pain.  Feels good.  Bowels moving. Saw gyn 09/2018. On estrogen/progesterone.  Discussed the need for colonoscopy.  She declines at this time. Discussed cologuard.  She declines due to financial reasons.  Will get her mammogram through [facility] at [facility].     ROS: See pertinent positives and negatives per HPI.  Past Medical History:  Diagnosis Date  . Endometriosis   . Hx of migraines   . Hypertension   . Seasonal allergies   . Shoulder fracture     Past Surgical History:  Procedure Laterality Date  . BREAST SURGERY  06/2004   augmentation  . LAPAROSCOPY  1999   Endometriosis  . LAPAROTOMY  1994   Endometriosis  . TONSILLECTOMY  1997   Partial  . TOTAL  ABDOMINAL HYSTERECTOMY W/ BILATERAL SALPINGOOPHORECTOMY  1999    Family History  Problem Relation Age of Onset  . Arthritis Mother   . Hypertension Father   . Alcohol abuse Father   . Stroke Father   . Arthritis Maternal Grandmother   . Heart disease Maternal Grandfather   . Hypertension Maternal Grandfather   . Cancer Neg Hx   . Colon cancer Neg Hx   . Ovarian cancer Neg Hx   . Breast cancer Neg Hx   . Diabetes Neg Hx     SOCIAL HX: reviewed.    Current Outpatient Medications:  .  amLODipine (NORVASC) 10 MG tablet, TAKE ONE TABLET BY MOUTH DAILY, Disp: 90 tablet, Rfl: 2 .  cetirizine (ZYRTEC) 10 MG tablet, Take 10 mg by mouth daily., Disp: , Rfl:  .  estradiol (ESTRACE) 1 MG tablet, TAKE ONE TABLET BY MOUTH DAILY, Disp: 90 tablet, Rfl: 0 .  progesterone (PROMETRIUM) 100 MG capsule, TAKE ONE CAPSULE BY MOUTH EVERY NIGHT AT BEDTIME, Disp: 90 capsule, Rfl: 0 .  spironolactone (ALDACTONE) 25 MG tablet, TAKE ONE TABLET BY MOUTH DAILY, Disp: 90 tablet, Rfl: 2  EXAM:  VITALS per patient if applicable:  178 lbs  GENERAL: alert, oriented, appears well and in no acute distress  HEENT: atraumatic, conjunttiva clear, no obvious abnormalities on inspection of external nose and ears  NECK: normal movements of the head and neck  LUNGS: on inspection no signs of respiratory distress, breathing rate appears normal,  no obvious gross SOB, gasping or wheezing  CV: no obvious cyanosis  PSYCH/NEURO: pleasant and cooperative, no obvious depression or anxiety, speech and thought processing grossly intact  ASSESSMENT AND PLAN:  Discussed the following assessment and plan:  Hypertension Pt not checking, but feels good. Does not feel elevated.  Discussed with her to spot check her pressure and send in readings.  Continue current medication regimen.    Status post TAH-BSO Saw gyn.  On estrogen/progesterone.  See gyn now.    Increased BMI She is doing well.  Has lost weight.  Follow.       I discussed the assessment and treatment plan with the patient. The patient was provided an opportunity to ask questions and all were answered. The patient agreed with the plan and demonstrated an understanding of the instructions.   The patient was advised to call back or seek an in-person evaluation if the symptoms worsen or if the condition fails to improve as anticipated.   Einar Pheasant, MD

## 2019-02-19 ENCOUNTER — Encounter: Payer: Self-pay | Admitting: Internal Medicine

## 2019-02-19 NOTE — Assessment & Plan Note (Signed)
She is doing well.  Has lost weight.  Follow.

## 2019-02-19 NOTE — Assessment & Plan Note (Signed)
Pt not checking, but feels good. Does not feel elevated.  Discussed with her to spot check her pressure and send in readings.  Continue current medication regimen.

## 2019-02-19 NOTE — Assessment & Plan Note (Signed)
Saw gyn.  On estrogen/progesterone.  See gyn now.

## 2019-02-24 ENCOUNTER — Encounter: Payer: Self-pay | Admitting: Internal Medicine

## 2019-04-30 ENCOUNTER — Other Ambulatory Visit: Payer: Self-pay | Admitting: Obstetrics and Gynecology

## 2019-04-30 DIAGNOSIS — Z79890 Hormone replacement therapy (postmenopausal): Secondary | ICD-10-CM

## 2019-06-20 ENCOUNTER — Ambulatory Visit: Payer: Self-pay | Admitting: Internal Medicine

## 2019-07-27 ENCOUNTER — Other Ambulatory Visit: Payer: Self-pay | Admitting: Obstetrics and Gynecology

## 2019-07-27 DIAGNOSIS — Z79890 Hormone replacement therapy (postmenopausal): Secondary | ICD-10-CM

## 2019-08-24 ENCOUNTER — Inpatient Hospital Stay: Admission: RE | Admit: 2019-08-24 | Payer: Self-pay | Source: Ambulatory Visit

## 2019-08-25 ENCOUNTER — Ambulatory Visit
Admission: RE | Admit: 2019-08-25 | Discharge: 2019-08-25 | Disposition: A | Discharge status: Home / Self Care | Payer: Self-pay | Source: Ambulatory Visit | Attending: Internal Medicine | Admitting: Internal Medicine

## 2019-08-25 DIAGNOSIS — Z1231 Encounter for screening mammogram for malignant neoplasm of breast: Secondary | ICD-10-CM | POA: Insufficient documentation

## 2019-09-13 ENCOUNTER — Telehealth: Payer: Self-pay | Admitting: Internal Medicine

## 2019-09-13 MED ORDER — SPIRONOLACTONE 25 MG PO TABS: ORAL_TABLET | ORAL | 2 refills | Status: DC

## 2019-09-13 MED ORDER — AMLODIPINE BESYLATE 10 MG PO TABS: ORAL_TABLET | ORAL | 2 refills | Status: DC

## 2019-09-13 NOTE — Telephone Encounter (Signed)
Pt needs a new prescription on amlodipine and spironalactone for a 90 day supply. She called pharmacy and they told her she needs to call us for a new prescription because they only have a 30 day supply not 90. Pt currently only has a few days left of medication.

## 2019-09-21 ENCOUNTER — Ambulatory Visit: Payer: Self-pay | Admitting: Obstetrics and Gynecology

## 2019-09-26 ENCOUNTER — Ambulatory Visit: Payer: Self-pay | Admitting: Obstetrics and Gynecology

## 2019-10-03 ENCOUNTER — Ambulatory Visit: Payer: Self-pay | Admitting: Obstetrics and Gynecology

## 2019-10-05 ENCOUNTER — Other Ambulatory Visit: Payer: Self-pay

## 2019-10-05 ENCOUNTER — Encounter: Payer: Self-pay | Admitting: Obstetrics and Gynecology

## 2019-10-05 ENCOUNTER — Ambulatory Visit: Payer: Self-pay | Admitting: Obstetrics and Gynecology

## 2019-10-05 VITALS — BP 142/84 | HR 79 | Ht 65.0 in | Wt 184.0 lb

## 2019-10-05 DIAGNOSIS — Z79890 Hormone replacement therapy (postmenopausal): Secondary | ICD-10-CM

## 2019-10-05 MED ORDER — PROGESTERONE MICRONIZED 100 MG PO CAPS: 100.0000 mg | ORAL_CAPSULE | Freq: Every day | ORAL | 3 refills | Status: DC

## 2019-10-05 MED ORDER — ESTRADIOL 1 MG PO TABS: 1.0000 mg | ORAL_TABLET | Freq: Every day | ORAL | 3 refills | Status: DC

## 2019-10-05 NOTE — Progress Notes (Signed)
Pt present for medication refill after starting HRT medication. Pt stated that she has noticed a great improvement since starting the medication.

## 2019-10-05 NOTE — Progress Notes (Signed)
HPI:      [name] is a 54 y.o. G0P0000 who LMP was No LMP recorded. Patient has had a hysterectomy.  Subjective:   She presents today for "medication refill".  Patient is self-pay and declines examination today.  She has previously had a hysterectomy and is up-to-date on her mammograms. She is taking Estrace and Prometrium.  She is aware that Prometrium is not required for prevention of endometrial hyperplasia but she likes taking that and believes her mood as well as her pelvic symptoms are improved while taking Prometrium.  Last year we decreased the dose from 200-100 and she has done well on 100.  She states that she is not ready to decrease to 0 yet.    Hx: The following portions of the patient's history were reviewed and updated as appropriate:             She  has a past medical history of Endometriosis, migraines, Hypertension, Seasonal allergies, and Shoulder fracture. She does not have any pertinent problems on file. She  has a past surgical history that includes laparotomy (1994); laparoscopy (1999); Tonsillectomy (1997); Breast surgery (06/2004); Total abdominal hysterectomy w/ bilateral salpingoophorectomy (1999); and Augmentation mammaplasty. Her family history includes Alcohol abuse in her father; Arthritis in her maternal grandmother and mother; Heart disease in her maternal grandfather; Hypertension in her father and maternal grandfather; Stroke in her father. She  reports that she has never smoked. She has never used smokeless tobacco. She reports current alcohol use. She reports that she does not use drugs. She has a current medication list which includes the following prescription(s): amlodipine, cetirizine, progesterone, spironolactone, estradiol, and progesterone. She is allergic to codeine sulfate, imitrex [sumatriptan], and percocet [oxycodone-acetaminophen].       Review of Systems:  Review of Systems  Constitutional: Denied constitutional symptoms, night  sweats, recent illness, fatigue, fever, insomnia and weight loss.  Eyes: Denied eye symptoms, eye pain, photophobia, vision change and visual disturbance.  Ears/Nose/Throat/Neck: Denied ear, nose, throat or neck symptoms, hearing loss, nasal discharge, sinus congestion and sore throat.  Cardiovascular: Denied cardiovascular symptoms, arrhythmia, chest pain/pressure, edema, exercise intolerance, orthopnea and palpitations.  Respiratory: Denied pulmonary symptoms, asthma, pleuritic pain, productive sputum, cough, dyspnea and wheezing.  Gastrointestinal: Denied, gastro-esophageal reflux, melena, nausea and vomiting.  Genitourinary: Denied genitourinary symptoms including symptomatic vaginal discharge, pelvic relaxation issues, and urinary complaints.  Musculoskeletal: Denied musculoskeletal symptoms, stiffness, swelling, muscle weakness and myalgia.  Dermatologic: Denied dermatology symptoms, rash and scar.  Neurologic: Denied neurology symptoms, dizziness, headache, neck pain and syncope.  Psychiatric: Denied psychiatric symptoms, anxiety and depression.  Endocrine: Denied endocrine symptoms including hot flashes and night sweats.   Meds:   Current Outpatient Medications on File Prior to Visit  Medication Sig Dispense Refill  . amLODipine (NORVASC) 10 MG tablet TAKE ONE TABLET BY MOUTH DAILY 90 tablet 2  . cetirizine (ZYRTEC) 10 MG tablet Take 10 mg by mouth daily.    . progesterone (PROMETRIUM) 100 MG capsule TAKE ONE CAPSULE BY MOUTH EVERY NIGHT AT BEDTIME 90 capsule 0  . spironolactone (ALDACTONE) 25 MG tablet TAKE ONE TABLET BY MOUTH DAILY 90 tablet 2   No current facility-administered medications on file prior to visit.    Objective:     Vitals:   10/05/19 1424  BP: (!) 142/84  Pulse: 79                Assessment:    G0P0000 Patient Active Problem List   Diagnosis  Date Noted  . Breast cancer screening 10/03/2018  . Colon cancer screening 10/03/2018  . Status post TAH-BSO  06/21/2017  . History of endometriosis 06/21/2017  . Skin lesion 06/21/2017  . Herpes zoster 08/20/2016  . Chronic pelvic pain in female 07/21/2014  . Decreased libido 07/21/2014  . Coitalgia 07/21/2014  . History of migraine headaches 07/21/2014  . Adult hypothyroidism 07/21/2014  . Increased BMI 07/21/2014  . Osteopenia 07/21/2014  . Allergic rhinitis, seasonal 07/21/2014  . Postsurgical menopause 07/21/2014  . Dryness of vagina 07/21/2014  . Health care maintenance 06/17/2014  . Hypertension 09/18/2012  . Endometriosis 09/18/2012     1. Postmenopausal hormone therapy     Patient doing well on current dosing regimen of HRT.   Plan:            1.  Continue HRT as currently prescribed.  We have discussed the possibility of changing her estrogen to patches instead of pills.  We have also discussed the possibility of discontinuation of Prometrium and the rationale that it is not necessary to prevent endometrial hyperplasia.  The patient would like to continue her Prometrium at this time. 2.  We have also discussed the typical regimen of annual examinations and breast examinations.  Patient states that her next check will include complete annual examination. Orders No orders of the defined types were placed in this encounter.    Meds ordered this encounter  Medications  . estradiol (ESTRACE) 1 MG tablet    Sig: Take 1 tablet (1 mg total) by mouth daily.    Dispense:  90 tablet    Refill:  3  . progesterone (PROMETRIUM) 100 MG capsule    Sig: Take 1 capsule (100 mg total) by mouth daily.    Dispense:  90 capsule    Refill:  3      F/U  Return for Annual Physical. I spent 21 minutes involved in the care of this patient preparing to see the patient by obtaining and reviewing her medical history (including labs, imaging tests and prior procedures), documenting clinical information in the electronic health record (EHR), counseling and coordinating care plans, writing and  sending prescriptions, ordering tests or procedures and directly communicating with the patient by discussing pertinent items from her history and physical exam as well as detailing my assessment and plan as noted above so that she has an informed understanding.  All of her questions were answered.  Finis Bud, M.D. 10/05/2019 2:50 PM

## 2020-03-21 ENCOUNTER — Other Ambulatory Visit: Payer: Self-pay | Admitting: Internal Medicine

## 2020-03-21 DIAGNOSIS — R1319 Other dysphagia: Secondary | ICD-10-CM

## 2020-03-27 ENCOUNTER — Ambulatory Visit
Admission: RE | Admit: 2020-03-27 | Discharge: 2020-03-27 | Disposition: A | Discharge status: Home / Self Care | Payer: Self-pay | Source: Ambulatory Visit | Attending: Internal Medicine | Admitting: Internal Medicine

## 2020-03-27 ENCOUNTER — Other Ambulatory Visit: Payer: Self-pay

## 2020-03-27 DIAGNOSIS — R1319 Other dysphagia: Secondary | ICD-10-CM | POA: Insufficient documentation

## 2020-05-09 ENCOUNTER — Other Ambulatory Visit: Payer: Self-pay

## 2020-05-09 ENCOUNTER — Encounter: Payer: Self-pay | Admitting: Podiatry

## 2020-05-09 ENCOUNTER — Other Ambulatory Visit: Payer: Self-pay | Admitting: Podiatry

## 2020-05-09 ENCOUNTER — Ambulatory Visit (INDEPENDENT_AMBULATORY_CARE_PROVIDER_SITE_OTHER): Payer: Self-pay | Admitting: Podiatry

## 2020-05-09 ENCOUNTER — Ambulatory Visit (INDEPENDENT_AMBULATORY_CARE_PROVIDER_SITE_OTHER): Payer: Self-pay

## 2020-05-09 DIAGNOSIS — S96912A Strain of unspecified muscle and tendon at ankle and foot level, left foot, initial encounter: Secondary | ICD-10-CM

## 2020-05-09 DIAGNOSIS — M7662 Achilles tendinitis, left leg: Secondary | ICD-10-CM

## 2020-05-09 DIAGNOSIS — S93602A Unspecified sprain of left foot, initial encounter: Secondary | ICD-10-CM

## 2020-05-09 MED ORDER — METHYLPREDNISOLONE 4 MG PO TBPK: ORAL_TABLET | ORAL | 0 refills | Status: DC

## 2020-05-09 MED ORDER — MELOXICAM 15 MG PO TABS
15.0000 mg | ORAL_TABLET | Freq: Every day | ORAL | 3 refills | Status: DC
Start: 2020-05-09 — End: 2020-05-09

## 2020-05-09 MED ORDER — MELOXICAM 15 MG PO TABS: 15.0000 mg | ORAL_TABLET | Freq: Every day | ORAL | 3 refills | Status: DC

## 2020-05-09 NOTE — Progress Notes (Signed)
Subjective:  Patient ID: Tracy Ward, female    DOB: [birth year],  MRN: [id number] HPI Chief Complaint  Patient presents with  . Foot Pain    Posterior heel/achilles left - aching, pulling x 2-3 weeks, worsened after being out in the snow with the dog, tried to wear good shoes and rest  . New Patient (Initial Visit)    55 y.o. female presents with the above complaint.   ROS: Denies fever chills nausea vomiting muscle aches pains calf pain back pain chest pain shortness of breath.  Past Medical History:  Diagnosis Date  . Endometriosis   . Hx of migraines   . Hypertension   . Seasonal allergies   . Shoulder fracture    Past Surgical History:  Procedure Laterality Date  . AUGMENTATION MAMMAPLASTY    . BREAST SURGERY  06/2004   augmentation  . LAPAROSCOPY  1999   Endometriosis  . LAPAROTOMY  1994   Endometriosis  . TONSILLECTOMY  1997   Partial  . TOTAL ABDOMINAL HYSTERECTOMY W/ BILATERAL SALPINGOOPHORECTOMY  1999    Current Outpatient Medications:  .  methylPREDNISolone (MEDROL DOSEPAK) 4 MG TBPK tablet, 6 day dose pack - take as directed, Disp: 21 tablet, Rfl: 0 .  amLODipine (NORVASC) 10 MG tablet, TAKE ONE TABLET BY MOUTH DAILY, Disp: 90 tablet, Rfl: 2 .  cetirizine (ZYRTEC) 10 MG tablet, Take 10 mg by mouth daily., Disp: , Rfl:  .  Cholecalciferol 25 MCG (1000 UT) tablet, Take by mouth., Disp: , Rfl:  .  Coenzyme Q10 10 MG capsule, Take by mouth., Disp: , Rfl:  .  estradiol (ESTRACE) 1 MG tablet, Take 1 tablet (1 mg total) by mouth daily., Disp: 90 tablet, Rfl: 3 .  meloxicam (MOBIC) 15 MG tablet, Take 1 tablet (15 mg total) by mouth daily., Disp: 90 tablet, Rfl: 3 .  Multiple Vitamin (MULTI-VITAMIN) tablet, Take 1 tablet by mouth daily., Disp: , Rfl:  .  progesterone (PROMETRIUM) 100 MG capsule, TAKE ONE CAPSULE BY MOUTH EVERY NIGHT AT BEDTIME, Disp: 90 capsule, Rfl: 0 .  progesterone (PROMETRIUM) 100 MG capsule, Take 1 capsule (100 mg total) by mouth daily.,  Disp: 90 capsule, Rfl: 3 .  spironolactone (ALDACTONE) 25 MG tablet, TAKE ONE TABLET BY MOUTH DAILY, Disp: 90 tablet, Rfl: 2  Allergies  Allergen Reactions  . Bee Pollen Itching and Swelling  . Codeine Sulfate Itching  . Imitrex [Sumatriptan]   . Percocet [Oxycodone-Acetaminophen]    Review of Systems Objective:  There were no vitals filed for this visit.  General: Well developed, nourished, in no acute distress, alert and oriented x3   Dermatological: Skin is warm, dry and supple bilateral. Nails x 10 are well maintained; remaining integument appears unremarkable at this time. There are no open sores, no preulcerative lesions, no rash or signs of infection present.  Vascular: Dorsalis Pedis artery and Posterior Tibial artery pedal pulses are 2/4 bilateral with immedate capillary fill time. Pedal hair growth present. No varicosities and no lower extremity edema present bilateral.   Neruologic: Grossly intact via light touch bilateral. Vibratory intact via tuning fork bilateral. Protective threshold with Semmes Wienstein monofilament intact to all pedal sites bilateral. Patellar and Achilles deep tendon reflexes 2+ bilateral. No Babinski or clonus noted bilateral.   Musculoskeletal: No gross boney pedal deformities bilateral. No pain, crepitus, or limitation noted with foot and ankle range of motion bilateral. Muscular strength 5/5 in all groups tested bilateral. Pain on palpation medial aspect of the left foot  just plantar or distal to the medial malleolus not including the posterior tibial tendon. It is more around the capsule of the subtalar joint. She also has some tenderness on palpation of that medial calcaneal nerve branch.  Gait: Unassisted, Nonantalgic.    Radiographs:  Radiographs taken today do not demonstrate any type of osseous abnormalities osseously mature individual demonstrates normal osseous architecture. No acute findings.  Assessment & Plan:   Assessment: Sprain of  the medial architecture of the subtalar joint left.  Plan: At this point started on a Medrol Dosepak to be followed by meloxicam and placed her in her cam walker which she already had from previous bunion surgery right.     Tracy Ward, [place]

## 2020-06-02 ENCOUNTER — Other Ambulatory Visit: Payer: Self-pay | Admitting: Internal Medicine

## 2020-06-03 ENCOUNTER — Ambulatory Visit (INDEPENDENT_AMBULATORY_CARE_PROVIDER_SITE_OTHER): Payer: Self-pay | Admitting: Podiatry

## 2020-06-03 ENCOUNTER — Other Ambulatory Visit: Payer: Self-pay

## 2020-06-03 ENCOUNTER — Encounter: Payer: Self-pay | Admitting: Podiatry

## 2020-06-03 DIAGNOSIS — M76822 Posterior tibial tendinitis, left leg: Secondary | ICD-10-CM

## 2020-06-03 DIAGNOSIS — M7662 Achilles tendinitis, left leg: Secondary | ICD-10-CM

## 2020-06-03 MED ORDER — DEXAMETHASONE SODIUM PHOSPHATE 120 MG/30ML IJ SOLN
2.0000 mg | Freq: Once | INTRAMUSCULAR | Status: AC
  Administered 2020-06-03: 2 mg via INTRA_ARTICULAR

## 2020-06-03 NOTE — Progress Notes (Signed)
Tracy Ward presents today for follow-up of her posterior tibial tendinitis and her electrical type sensation she was experiencing pain through her medial right ankle and foot. She states that that has resolved anywhere from 30 to 50% utilizing her cam walker steroids and the nonsteroidals. She states that she feels a more specific area now as she points to the posterior medial aspect of her left heel.  Objective: Vital signs are stable alert oriented x3 much decrease in edema and palpable symptoms of the posterior tibial tendon though it is still somewhat fluctuant demonstrating some probable tendinitis. It is intact and it does have good strength at this point. As does her Achilles, mild tenderness at the insertion of the medial fibers and none laterally. She does have some tenderness on palpation of the body of the Achilles superior to the posterior aspect of her calcaneus.  Assessment: Possible insertional Achilles tendinitis with some compensatory posterior tibial tendinitis.  Plan: At this point I recommended dexamethasone injection to the distal medial aspect of her Achilles area. Making sure not to inject into the tendon we injected 2 mg of dexamethasone and local anesthetic. I recommended for her to use a night splint on the left foot. We talked about ice heat and continued oral antiinflammatories. I will follow-up with her in 3 weeks if not considerably improved we will schedule MRI.

## 2020-06-26 ENCOUNTER — Ambulatory Visit: Payer: Self-pay | Admitting: Podiatry

## 2020-07-03 ENCOUNTER — Encounter: Payer: Self-pay | Admitting: Podiatry

## 2020-07-03 ENCOUNTER — Ambulatory Visit (INDEPENDENT_AMBULATORY_CARE_PROVIDER_SITE_OTHER): Payer: Self-pay | Admitting: Podiatry

## 2020-07-03 ENCOUNTER — Other Ambulatory Visit: Payer: Self-pay

## 2020-07-03 DIAGNOSIS — G5752 Tarsal tunnel syndrome, left lower limb: Secondary | ICD-10-CM

## 2020-07-03 DIAGNOSIS — S86012A Strain of left Achilles tendon, initial encounter: Secondary | ICD-10-CM

## 2020-07-03 NOTE — Addendum Note (Signed)
Addended by: Kristian Covey on: 07/03/2020 03:27 PM   Modules accepted: Orders

## 2020-07-03 NOTE — Progress Notes (Signed)
She presents today for a follow-up of her left heel.  States is been bothering her for quite some time and is has not improved at all.  States that she has noticed more swelling in her legs and is wondering if that may be contributary as well.  States that hurts right in here she points to the area of just behind the medial malleolus and just anterior to the Achilles tendon.  Objective: Vital signs are stable she is alert oriented x3.  Pulses are palpable.  She has pain on palpation to the space between the posterior tibial tendon and the anterior fibers of the Achilles.  She also has pain on palpation of the tarsal tunnel without radiating pain.  Assessment: Most likely she has tears of the medial fibers of the Achilles tendon but we also cannot rule out tarsal tunnel issues.  Plan: Discussed etiology pathology conservative versus surgical therapies at this point after all of these conservative therapies have failed I feel that it is most necessary for MRI for differential diagnosis and surgical consideration.  I will follow-up with her once the MRI is complete

## 2020-07-17 ENCOUNTER — Ambulatory Visit
Admission: RE | Admit: 2020-07-17 | Discharge: 2020-07-17 | Disposition: A | Discharge status: Home / Self Care | Payer: No Typology Code available for payment source | Source: Ambulatory Visit | Attending: Podiatry | Admitting: Podiatry

## 2020-07-17 DIAGNOSIS — G5752 Tarsal tunnel syndrome, left lower limb: Secondary | ICD-10-CM

## 2020-07-17 DIAGNOSIS — S86012A Strain of left Achilles tendon, initial encounter: Secondary | ICD-10-CM

## 2020-07-22 ENCOUNTER — Telehealth: Payer: Self-pay

## 2020-07-22 DIAGNOSIS — M7662 Achilles tendinitis, left leg: Secondary | ICD-10-CM

## 2020-07-22 NOTE — Telephone Encounter (Signed)
-----   Message from Elinor Parkinson, [place] sent at 07/18/2020  5:23 PM EDT ----- No tear of the achilles.  She can go to Pt.  That would be the best option at this time.

## 2020-07-22 NOTE — Telephone Encounter (Signed)
-----   Message from Max T Hyatt, DPM sent at 07/18/2020  5:23 PM EDT ----- No tear of the achilles.  She can go to Pt.  That would be the best option at this time. 

## 2020-07-22 NOTE — Telephone Encounter (Signed)
Patient has been notified of MRI results and given the recommendation of physical therapy per Dr. Al Corpus.  She would like to try PT and follow back up with Dr. Al Corpus is a month.  Referral has been faxed to [facility]  Fax# [phone]

## 2020-07-22 NOTE — Telephone Encounter (Signed)
Left message for patient to call back and discuss results.

## 2020-08-12 ENCOUNTER — Encounter (INDEPENDENT_AMBULATORY_CARE_PROVIDER_SITE_OTHER): Payer: Self-pay | Admitting: Vascular Surgery

## 2020-10-08 ENCOUNTER — Encounter: Payer: Self-pay | Admitting: Obstetrics and Gynecology

## 2020-10-10 ENCOUNTER — Ambulatory Visit (INDEPENDENT_AMBULATORY_CARE_PROVIDER_SITE_OTHER): Payer: Self-pay | Admitting: Obstetrics and Gynecology

## 2020-10-10 ENCOUNTER — Other Ambulatory Visit: Payer: Self-pay

## 2020-10-10 VITALS — BP 162/99 | HR 82 | Ht 65.0 in | Wt 183.1 lb

## 2020-10-10 DIAGNOSIS — Z01419 Encounter for gynecological examination (general) (routine) without abnormal findings: Secondary | ICD-10-CM

## 2020-10-10 DIAGNOSIS — Z79890 Hormone replacement therapy (postmenopausal): Secondary | ICD-10-CM

## 2020-10-10 MED ORDER — PROGESTERONE MICRONIZED 100 MG PO CAPS: 100.0000 mg | ORAL_CAPSULE | Freq: Every day | ORAL | 3 refills | Status: DC

## 2020-10-10 MED ORDER — ESTRADIOL 1 MG PO TABS: 1.0000 mg | ORAL_TABLET | Freq: Every day | ORAL | 3 refills | Status: DC

## 2020-10-10 NOTE — Progress Notes (Signed)
HPI:      [name] is a 55 y.o. G0P0000 who LMP was No LMP recorded. Patient has had a hysterectomy.  Subjective:   She presents today for her annual examination.  Patient is self-pay for her health care and thus likes to within reason direct timing of testing and examinations.  She continues to take HRT.She is taking Estrace and Prometrium.  She is aware that Prometrium is not required for prevention of endometrial hyperplasia but she likes taking that and believes her mood as well as her pelvic symptoms are improved while taking Prometrium. She has had a previous hysterectomy. Her mammogram 1 year ago was normal and she would like to get mammograms every other year.    Hx: The following portions of the patient's history were reviewed and updated as appropriate:             She  has a past medical history of Endometriosis, migraines, Hypertension, Seasonal allergies, and Shoulder fracture. She does not have any pertinent problems on file. She  has a past surgical history that includes laparotomy (1994); laparoscopy (1999); Tonsillectomy (1997); Breast surgery (06/2004); Total abdominal hysterectomy w/ bilateral salpingoophorectomy (1999); and Augmentation mammaplasty. Her family history includes Alcohol abuse in her father; Arthritis in her maternal grandmother and mother; Heart disease in her maternal grandfather; Hypertension in her father and maternal grandfather; Stroke in her father. She  reports that she has never smoked. She has never used smokeless tobacco. She reports current alcohol use. She reports that she does not use drugs. She has a current medication list which includes the following prescription(s): amlodipine, cetirizine, cholecalciferol, coenzyme q10, meloxicam, multi-vitamin, progesterone, spironolactone, estradiol, and progesterone. She is allergic to bee pollen, codeine sulfate, imitrex [sumatriptan], and percocet [oxycodone-acetaminophen].       Review of  Systems:  Review of Systems  Constitutional: Denied constitutional symptoms, night sweats, recent illness, fatigue, fever, insomnia and weight loss.  Eyes: Denied eye symptoms, eye pain, photophobia, vision change and visual disturbance.  Ears/Nose/Throat/Neck: Denied ear, nose, throat or neck symptoms, hearing loss, nasal discharge, sinus congestion and sore throat.  Cardiovascular: Denied cardiovascular symptoms, arrhythmia, chest pain/pressure, edema, exercise intolerance, orthopnea and palpitations.  Respiratory: Denied pulmonary symptoms, asthma, pleuritic pain, productive sputum, cough, dyspnea and wheezing.  Gastrointestinal: Denied, gastro-esophageal reflux, melena, nausea and vomiting.  Genitourinary: Denied genitourinary symptoms including symptomatic vaginal discharge, pelvic relaxation issues, and urinary complaints.  Musculoskeletal: Denied musculoskeletal symptoms, stiffness, swelling, muscle weakness and myalgia.  Dermatologic: Denied dermatology symptoms, rash and scar.  Neurologic: Denied neurology symptoms, dizziness, headache, neck pain and syncope.  Psychiatric: Denied psychiatric symptoms, anxiety and depression.  Endocrine: Denied endocrine symptoms including hot flashes and night sweats.   Meds:   Current Outpatient Medications on File Prior to Visit  Medication Sig Dispense Refill   amLODipine (NORVASC) 10 MG tablet TAKE ONE TABLET BY MOUTH DAILY 90 tablet 2   cetirizine (ZYRTEC) 10 MG tablet Take 10 mg by mouth daily.     Cholecalciferol 25 MCG (1000 UT) tablet Take by mouth.     Coenzyme Q10 10 MG capsule Take by mouth.     meloxicam (MOBIC) 15 MG tablet Take 1 tablet (15 mg total) by mouth daily. 90 tablet 3   Multiple Vitamin (MULTI-VITAMIN) tablet Take 1 tablet by mouth daily.     progesterone (PROMETRIUM) 100 MG capsule TAKE ONE CAPSULE BY MOUTH EVERY NIGHT AT BEDTIME 90 capsule 0   spironolactone (ALDACTONE) 25 MG tablet TAKE ONE TABLET  BY MOUTH DAILY 90  tablet 2   No current facility-administered medications on file prior to visit.          Objective:     Vitals:   10/10/20 1333  BP: (!) 162/99  Pulse: 82    Filed Weights   10/10/20 1333  Weight: 183 lb 1.6 oz (83.1 kg)              Physical examination General NAD, Conversant  HEENT Atraumatic; Op clear with mmm.  Normo-cephalic. Pupils reactive. Anicteric sclerae  Thyroid/Neck Smooth without nodularity or enlargement. Normal ROM.  Neck Supple.  Skin No rashes, lesions or ulceration. Normal palpated skin turgor. No nodularity.  Breasts: No masses or discharge.  Symmetric.  No axillary adenopathy.  Bilateral implants  Lungs: Clear to auscultation.No rales or wheezes. Normal Respiratory effort, no retractions.  Heart: NSR.  No murmurs or rubs appreciated. No periferal edema  Abdomen: Soft.  Non-tender.  No masses.  No HSM. No hernia  Extremities: Moves all appropriately.  Normal ROM for age. No lymphadenopathy.  Neuro: Oriented to PPT.  Normal mood. Normal affect.     Pelvic:   Vulva: Normal appearance.  No lesions.   Vagina: No lesions or abnormalities noted.  Support: Normal pelvic support.  Urethra No masses tenderness or scarring.  Meatus Normal size without lesions or prolapse.  Cervix: Surgically absent   Anus: Normal exam.  No lesions.  Perineum: Normal exam.  No lesions.        Bimanual   Uterus: Surgically absent   Adnexae: No masses.  Non-tender to palpation.  Cul-de-sac: Negative for abnormality.     Assessment:    G0P0000 Patient Active Problem List   Diagnosis Date Noted   Breast cancer screening 10/03/2018   Colon cancer screening 10/03/2018   Status post TAH-BSO 06/21/2017   History of endometriosis 06/21/2017   Skin lesion 06/21/2017   Herpes zoster 08/20/2016   Chronic pelvic pain in female 07/21/2014   Decreased libido 07/21/2014   Coitalgia 07/21/2014   History of migraine headaches 07/21/2014   Adult hypothyroidism 07/21/2014    Increased BMI 07/21/2014   Osteopenia 07/21/2014   Allergic rhinitis, seasonal 07/21/2014   Postsurgical menopause 07/21/2014   Dryness of vagina 07/21/2014   Health care maintenance 06/17/2014   Hypertension 09/18/2012   Endometriosis 09/18/2012     1. Well woman exam with routine gynecological exam   2. Postmenopausal hormone therapy     Normal exam -patient without complaints  She would like to continue her HRT at the current dosing including Prometrium.   Plan:            1.  Basic Screening Recommendations The basic screening recommendations for asymptomatic women were discussed with the patient during her visit.  The age-appropriate recommendations were discussed with her and the rational for the tests reviewed.  When I am informed by the patient that another primary care physician has previously obtained the age-appropriate tests and they are up-to-date, only outstanding tests are ordered and referrals given as necessary.  Abnormal results of tests will be discussed with her when all of her results are completed.  Routine preventative health maintenance measures emphasized: Exercise/Diet/Weight control, Tobacco Warnings, Alcohol/Substance use risks and Stress Management 2.  Continue HRT Orders No orders of the defined types were placed in this encounter.    Meds ordered this encounter  Medications   estradiol (ESTRACE) 1 MG tablet    Sig: Take 1 tablet (1 mg total)  by mouth daily.    Dispense:  90 tablet    Refill:  3   progesterone (PROMETRIUM) 100 MG capsule    Sig: Take 1 capsule (100 mg total) by mouth daily.    Dispense:  90 capsule    Refill:  3           F/U  Return in about 1 year (around 10/10/2021) for Annual Physical.  Elonda Husky, M.D. 10/10/2020 1:48 PM

## 2021-02-23 ENCOUNTER — Other Ambulatory Visit: Payer: Self-pay

## 2021-02-23 ENCOUNTER — Emergency Department
Admission: EM | Admit: 2021-02-23 | Discharge: 2021-02-23 | Disposition: A | Discharge status: Home / Self Care | Payer: No Typology Code available for payment source | Attending: Emergency Medicine | Admitting: Emergency Medicine

## 2021-02-23 ENCOUNTER — Emergency Department: Payer: No Typology Code available for payment source

## 2021-02-23 DIAGNOSIS — M542 Cervicalgia: Secondary | ICD-10-CM | POA: Insufficient documentation

## 2021-02-23 DIAGNOSIS — Z79899 Other long term (current) drug therapy: Secondary | ICD-10-CM | POA: Insufficient documentation

## 2021-02-23 DIAGNOSIS — I10 Essential (primary) hypertension: Secondary | ICD-10-CM | POA: Insufficient documentation

## 2021-02-23 DIAGNOSIS — R519 Headache, unspecified: Secondary | ICD-10-CM | POA: Insufficient documentation

## 2021-02-23 LAB — CBC
HCT: 43.1 % (ref 36.0–46.0)
Hemoglobin: 14.3 g/dL (ref 12.0–15.0)
MCH: 29.7 pg (ref 26.0–34.0)
MCHC: 33.2 g/dL (ref 30.0–36.0)
MCV: 89.4 fL (ref 80.0–100.0)
Platelets: 291 10*3/uL (ref 150–400)
RBC: 4.82 MIL/uL (ref 3.87–5.11)
RDW: 12.3 % (ref 11.5–15.5)
WBC: 7 10*3/uL (ref 4.0–10.5)
nRBC: 0 % (ref 0.0–0.2)

## 2021-02-23 LAB — BASIC METABOLIC PANEL
Anion gap: 7 (ref 5–15)
BUN: 17 mg/dL (ref 6–20)
CO2: 29 mmol/L (ref 22–32)
Calcium: 9.3 mg/dL (ref 8.9–10.3)
Chloride: 102 mmol/L (ref 98–111)
Creatinine, Ser: 0.79 mg/dL (ref 0.44–1.00)
GFR, Estimated: >60 mL/min (ref >60)
Glucose, Bld: 103 mg/dL — ABNORMAL HIGH (ref 70–99)
Potassium: 4.1 mmol/L (ref 3.5–5.1)
Sodium: 138 mmol/L (ref 135–145)

## 2021-02-23 LAB — TROPONIN I (HIGH SENSITIVITY)
Troponin I (High Sensitivity): 5 ng/L (ref <18)
Troponin I (High Sensitivity): 5 ng/L (ref <18)

## 2021-02-23 MED ORDER — METOCLOPRAMIDE HCL 10 MG PO TABS
10.0000 mg | ORAL_TABLET | Freq: Once | ORAL | Status: AC
  Administered 2021-02-23: 10 mg via ORAL
  Filled 2021-02-23: qty 1

## 2021-02-23 MED ORDER — HYDROCHLOROTHIAZIDE 12.5 MG PO CAPS: 12.5000 mg | ORAL_CAPSULE | Freq: Every day | ORAL | 11 refills | Status: DC

## 2021-02-23 MED ORDER — IBUPROFEN 400 MG PO TABS: 400.0000 mg | ORAL_TABLET | Freq: Four times a day (QID) | ORAL | 0 refills | Status: DC | PRN

## 2021-02-23 MED ORDER — METOCLOPRAMIDE HCL 10 MG PO TABS: 10.0000 mg | ORAL_TABLET | Freq: Three times a day (TID) | ORAL | 0 refills | Status: DC

## 2021-02-23 MED ORDER — IBUPROFEN 400 MG PO TABS
400.0000 mg | ORAL_TABLET | Freq: Once | ORAL | Status: AC
  Administered 2021-02-23: 400 mg via ORAL
  Filled 2021-02-23: qty 1

## 2021-02-23 NOTE — ED Provider Notes (Signed)
[facility]  ____________________________________________   Event Date/Time   First MD Initiated Contact with Patient 02/23/21 [phone]     (approximate)  I have reviewed the triage vital signs and the nursing notes.   HISTORY  Chief Complaint Hypertension    HPI Tracy Ward is a 55 y.o. female with past medical history of hypertension, migraine headache who presents with headache and neck pain.  Symptoms started about 2 days ago.  Headache was gradual in onset not maximal in onset.  Described as diffuse in the bilateral anterior head extending to the back of her head as well.  She also endorses neck stiffness.  Denies any injury.  She denies associated visual change, numbness, weakness nausea or vomiting.  Does have some photophobia.  Patient has had migraines in the past but it is usually not associated with neck pain and not this diffuse.  She denies fevers or chills.  She checked her blood pressure and was significantly elevated at home in the 200s and she attributed her headache to this.  Denies chest pain abdominal pain.  Patient takes spironolactone for her blood pressure.         Past Medical History:  Diagnosis Date   Endometriosis    Hx of migraines    Hypertension    Seasonal allergies    Shoulder fracture     Patient Active Problem List   Diagnosis Date Noted   Breast cancer screening 10/03/2018   Colon cancer screening 10/03/2018   Status post TAH-BSO 06/21/2017   History of endometriosis 06/21/2017   Skin lesion 06/21/2017   Herpes zoster 08/20/2016   Chronic pelvic pain in female 07/21/2014   Decreased libido 07/21/2014   Coitalgia 07/21/2014   History of migraine headaches 07/21/2014   Adult hypothyroidism 07/21/2014   Increased BMI 07/21/2014   Osteopenia 07/21/2014   Allergic rhinitis, seasonal 07/21/2014   Postsurgical menopause 07/21/2014   Dryness of vagina 07/21/2014   Health care maintenance 06/17/2014   Hypertension  09/18/2012   Endometriosis 09/18/2012    Past Surgical History:  Procedure Laterality Date   AUGMENTATION MAMMAPLASTY     BREAST SURGERY  06/2004   augmentation   LAPAROSCOPY  1999   Endometriosis   LAPAROTOMY  1994   Endometriosis   TONSILLECTOMY  1997   Partial   TOTAL ABDOMINAL HYSTERECTOMY W/ BILATERAL SALPINGOOPHORECTOMY  1999    Prior to Admission medications   Medication Sig Start Date End Date Taking? Authorizing Provider  hydrochlorothiazide (MICROZIDE) 12.5 MG capsule Take 1 capsule (12.5 mg total) by mouth daily. 02/23/21 02/23/22 Yes Georga Hacking, MD  ibuprofen (ADVIL) 400 MG tablet Take 1 tablet (400 mg total) by mouth every 6 (six) hours as needed. 02/23/21  Yes Georga Hacking, MD  metoCLOPramide (REGLAN) 10 MG tablet Take 1 tablet (10 mg total) by mouth 3 (three) times daily with meals for 7 days. 02/23/21 03/02/21 Yes Georga Hacking, MD  amLODipine (NORVASC) 10 MG tablet TAKE ONE TABLET BY MOUTH DAILY 06/03/20   Dale Kremmling, MD  cetirizine (ZYRTEC) 10 MG tablet Take 10 mg by mouth daily.    [provider]  Cholecalciferol 25 MCG (1000 UT) tablet Take by mouth.    [provider]  Coenzyme Q10 10 MG capsule Take by mouth.    [provider]  estradiol (ESTRACE) 1 MG tablet Take 1 tablet (1 mg total) by mouth daily. 10/10/20 10/10/21  Linzie Collin, MD  meloxicam (MOBIC) 15 MG  tablet Take 1 tablet (15 mg total) by mouth daily. 05/09/20   Hyatt, Max T, DPM  Multiple Vitamin (MULTI-VITAMIN) tablet Take 1 tablet by mouth daily.    [provider]  progesterone (PROMETRIUM) 100 MG capsule TAKE ONE CAPSULE BY MOUTH EVERY NIGHT AT BEDTIME 07/27/19   Harlin Heys, MD  progesterone (PROMETRIUM) 100 MG capsule Take 1 capsule (100 mg total) by mouth daily. 10/10/20 10/10/21  Harlin Heys, MD  spironolactone (ALDACTONE) 25 MG tablet TAKE ONE TABLET BY MOUTH DAILY 06/03/20   Einar Pheasant, MD    Allergies Bee pollen,  Codeine sulfate, Imitrex [sumatriptan], and Percocet [oxycodone-acetaminophen]  Family History  Problem Relation Age of Onset   Arthritis Mother    Hypertension Father    Alcohol abuse Father    Stroke Father    Arthritis Maternal Grandmother    Heart disease Maternal Grandfather    Hypertension Maternal Grandfather    Cancer Neg Hx    Colon cancer Neg Hx    Ovarian cancer Neg Hx    Breast cancer Neg Hx    Diabetes Neg Hx     Social History Social History   Tobacco Use   Smoking status: Never   Smokeless tobacco: Never  Vaping Use   Vaping Use: Never used  Substance Use Topics   Alcohol use: Yes    Alcohol/week: 0.0 standard drinks    Comment: social   Drug use: No    Review of Systems   Review of Systems  Constitutional:  Negative for chills and fever.  Eyes:  Positive for photophobia. Negative for visual disturbance.  Respiratory:  Negative for shortness of breath.   Cardiovascular:  Negative for chest pain.  Gastrointestinal:  Negative for abdominal pain, diarrhea and vomiting.  Musculoskeletal:  Positive for neck pain.  Neurological:  Positive for headaches. Negative for weakness and numbness.  All other systems reviewed and are negative.  Physical Exam Updated Vital Signs BP (!) 164/117 (BP Location: Left Arm)   Pulse 75   Temp 98.5 F (36.9 C) (Oral)   Resp 16   Ht 5\' 5"  (1.651 m)   Wt 86.2 kg   SpO2 98%   BMI 31.62 kg/m   Physical Exam Vitals and nursing note reviewed.  Constitutional:      General: She is not in acute distress.    Appearance: Normal appearance.  HENT:     Head: Normocephalic and atraumatic.  Eyes:     General: No scleral icterus.    Conjunctiva/sclera: Conjunctivae normal.  Pulmonary:     Effort: Pulmonary effort is normal. No respiratory distress.     Breath sounds: No stridor.  Musculoskeletal:        General: No deformity or signs of injury.     Cervical back: Normal range of motion.     Comments: Mild tenderness to  palpation the bilateral cervical paraspinal region  Skin:    General: Skin is dry.     Coloration: Skin is not jaundiced or pale.  Neurological:     General: No focal deficit present.     Mental Status: She is alert and oriented to person, place, and time. Mental status is at baseline.     Comments: Aox3, nml speech  PERRL, face symmetric, nml tongue movement  L eye weakness with abduction- chronic 5/5 strength in the BL upper and lower extremities  Sensation grossly intact in the BL upper and lower extremities  Finger-nose-finger intact BL   Psychiatric:  Mood and Affect: Mood normal.        Behavior: Behavior normal.     LABS (all labs ordered are listed, but only abnormal results are displayed)  Labs Reviewed  BASIC METABOLIC PANEL - Abnormal; Notable for the following components:      Result Value   Glucose, Bld 103 (*)    All other components within normal limits  CBC  TROPONIN I (HIGH SENSITIVITY)  TROPONIN I (HIGH SENSITIVITY)   ____________________________________________  EKG  NSR, nml axis, nml intervals, no acute ischemic changes  ____________________________________________  RADIOLOGY Almeta Monas, personally viewed and evaluated these images (plain radiographs) as part of my medical decision making, as well as reviewing the written report by the radiologist.  ED MD interpretation:  I reviewed the CXR which does not show any acute cardiopulmonary process      ____________________________________________   PROCEDURES  Procedure(s) performed (including Critical Care):  Procedures   ____________________________________________   INITIAL IMPRESSION / ASSESSMENT AND PLAN / ED COURSE     Patient is a 55 year old female who presents primarily with headache and concern for elevated blood pressure.  Headache has been going on for the past 2 days and her associated with neck pain as well.  There is no associated injury.  She has no red  flag symptoms including headache sudden or maximal in onset, worst headache of life, visual change or other neurologic symptoms.  She is significantly hypertensive here but is in pain.  Stingley is on spironolactone for elevated blood pressure but is apparently compliant with this.  On exam she has no new abnormal neurologic findings.  Of note she does have weakness with left eye abduction but tells me that this is chronic and she has always had this is not new.  She has no midline spinal tenderness.  Given her reassuring exam do not feel that head imaging is necessary at this time.  Suspect atypical migraine versus tension type headache low.  Low suspicion for hypertensive emergency given her normal mental status and no neurologic signs, I feel that her blood pressure is most likely result of her pain and likely chronically uncontrolled hypertension.  Does mention to me that she had recent chiropractic manipulation so I did consider potential cervical artery dissection however her neck pain is bilateral, and she has no associated neurologic symptoms.  Patient also tells me she has had neck pain in the past so I think this is overall unlikely.  She was given Motrin and Reglan.  On reassessment she feels significantly improved ranging her neck normally and would like to be discharged.  We will start her on HCTZ given her significantly elevated blood pressure here.  I advised that she follow-up with her primary care provider.  Also discharged with Motrin and Reglan.  We discussed return precautions for any new neurologic symptoms or worsening headache.     ____________________________________________   FINAL CLINICAL IMPRESSION(S) / ED DIAGNOSES  Final diagnoses:  Nonintractable headache, unspecified chronicity pattern, unspecified headache type  Hypertension, unspecified type     ED Discharge Orders          Ordered    ibuprofen (ADVIL) 400 MG tablet  Every 6 hours PRN        02/23/21 0928     metoCLOPramide (REGLAN) 10 MG tablet  3 times daily with meals        02/23/21 0928    hydrochlorothiazide (MICROZIDE) 12.5 MG capsule  Daily  02/23/21 [id number]             Note:  This document was prepared using Dragon voice recognition software and may include unintentional dictation errors.    Rada Hay, MD 02/23/21 [id number]

## 2021-02-23 NOTE — ED Triage Notes (Signed)
Patient c/o hypertension, neck pain, jaw pain, headache, nausea, generalized malaise.

## 2021-02-23 NOTE — Discharge Instructions (Addendum)
If your headache is worsening or you are developing any new neurologic symptoms, please return to the emergency department immediately.  You can take Motrin and Reglan for any ongoing headache symptoms.  Please follow-up with your primary care provider regarding your high blood pressure.  Please start taking the HCTZ daily for blood pressure and keep checking your blood pressure at home and keep a record of this to show to your primary care provider.

## 2021-06-15 ENCOUNTER — Other Ambulatory Visit: Payer: Self-pay | Admitting: Podiatry

## 2021-09-10 ENCOUNTER — Other Ambulatory Visit: Payer: Self-pay | Admitting: Obstetrics and Gynecology

## 2021-09-10 DIAGNOSIS — Z79890 Hormone replacement therapy (postmenopausal): Secondary | ICD-10-CM

## 2021-10-14 ENCOUNTER — Encounter: Payer: Self-pay | Admitting: Obstetrics and Gynecology

## 2021-12-12 ENCOUNTER — Encounter: Payer: Self-pay | Admitting: Obstetrics and Gynecology

## 2022-02-16 ENCOUNTER — Encounter (INDEPENDENT_AMBULATORY_CARE_PROVIDER_SITE_OTHER): Payer: Self-pay

## 2022-05-17 ENCOUNTER — Other Ambulatory Visit: Payer: Self-pay | Admitting: Podiatry

## 2022-05-19 ENCOUNTER — Encounter: Payer: Self-pay | Admitting: Obstetrics and Gynecology

## 2022-05-19 ENCOUNTER — Ambulatory Visit (INDEPENDENT_AMBULATORY_CARE_PROVIDER_SITE_OTHER): Payer: Self-pay | Admitting: Obstetrics and Gynecology

## 2022-05-19 DIAGNOSIS — Z1231 Encounter for screening mammogram for malignant neoplasm of breast: Secondary | ICD-10-CM

## 2022-05-19 DIAGNOSIS — Z79890 Hormone replacement therapy (postmenopausal): Secondary | ICD-10-CM

## 2022-05-19 DIAGNOSIS — Z01419 Encounter for gynecological examination (general) (routine) without abnormal findings: Secondary | ICD-10-CM

## 2022-05-19 MED ORDER — PROGESTERONE MICRONIZED 100 MG PO CAPS: 100.0000 mg | ORAL_CAPSULE | Freq: Every day | ORAL | 3 refills | Status: DC

## 2022-05-19 MED ORDER — ESTRADIOL 0.05 MG/24HR TD PTWK: 0.0500 mg | MEDICATED_PATCH | TRANSDERMAL | 3 refills | Status: DC

## 2022-05-19 NOTE — Progress Notes (Signed)
HPI:      [name] is a 57 y.o. G0P0000 who LMP was No LMP recorded. Patient has had a hysterectomy.  Subjective:   She presents today for her annual examination.  She has no complaints.  She reports that she continues to use Prometrium because if she does not she feels very emotional and she thinks it "evens her out". In addition she has decided that she would rather try an estrogen patch to see if it would help her with her blood pressures.    Hx: The following portions of the patient's history were reviewed and updated as appropriate:             She  has a past medical history of Endometriosis, migraines, Hypertension, Seasonal allergies, and Shoulder fracture. She does not have any pertinent problems on file. She  has a past surgical history that includes laparotomy (1994); laparoscopy (1999); Tonsillectomy (1997); Breast surgery (06/2004); Total abdominal hysterectomy w/ bilateral salpingoophorectomy (1999); and Augmentation mammaplasty. Her family history includes Alcohol abuse in her father; Arthritis in her maternal grandmother and mother; Heart disease in her maternal grandfather; Hypertension in her father and maternal grandfather; Stroke in her father. She  reports that she has never smoked. She has never used smokeless tobacco. She reports current alcohol use. She reports that she does not use drugs. She has a current medication list which includes the following prescription(s): cetirizine, cholecalciferol, coenzyme q10, estradiol, losartan, meloxicam, multi-vitamin, pantoprazole, progesterone, amlodipine, and progesterone. She is allergic to bee pollen, codeine sulfate, imitrex [sumatriptan], and percocet [oxycodone-acetaminophen].       Review of Systems:  Review of Systems  Constitutional: Denied constitutional symptoms, night sweats, recent illness, fatigue, fever, insomnia and weight loss.  Eyes: Denied eye symptoms, eye pain, photophobia, vision change and visual  disturbance.  Ears/Nose/Throat/Neck: Denied ear, nose, throat or neck symptoms, hearing loss, nasal discharge, sinus congestion and sore throat.  Cardiovascular: Denied cardiovascular symptoms, arrhythmia, chest pain/pressure, edema, exercise intolerance, orthopnea and palpitations.  Respiratory: Denied pulmonary symptoms, asthma, pleuritic pain, productive sputum, cough, dyspnea and wheezing.  Gastrointestinal: Denied, gastro-esophageal reflux, melena, nausea and vomiting.  Genitourinary: Denied genitourinary symptoms including symptomatic vaginal discharge, pelvic relaxation issues, and urinary complaints.  Musculoskeletal: Denied musculoskeletal symptoms, stiffness, swelling, muscle weakness and myalgia.  Dermatologic: Denied dermatology symptoms, rash and scar.  Neurologic: Denied neurology symptoms, dizziness, headache, neck pain and syncope.  Psychiatric: Denied psychiatric symptoms, anxiety and depression.  Endocrine: Denied endocrine symptoms including hot flashes and night sweats.   Meds:   Current Outpatient Medications on File Prior to Visit  Medication Sig Dispense Refill   cetirizine (ZYRTEC) 10 MG tablet Take 10 mg by mouth daily.     Cholecalciferol 25 MCG (1000 UT) tablet Take by mouth.     Coenzyme Q10 10 MG capsule Take by mouth.     losartan (COZAAR) 50 MG tablet Take 50 mg by mouth in the morning and at bedtime.     meloxicam (MOBIC) 15 MG tablet TAKE 1 TABLET BY MOUTH DAILY 90 tablet 3   Multiple Vitamin (MULTI-VITAMIN) tablet Take 1 tablet by mouth daily.     pantoprazole (PROTONIX) 20 MG tablet Take 40 mg by mouth daily.     progesterone (PROMETRIUM) 100 MG capsule TAKE ONE CAPSULE BY MOUTH EVERY NIGHT AT BEDTIME 90 capsule 0   amLODipine (NORVASC) 10 MG tablet TAKE ONE TABLET BY MOUTH DAILY (Patient not taking: Reported on 05/19/2022) 90 tablet 2   No current facility-administered  medications on file prior to visit.     Objective:     There were no vitals filed  for this visit.  There were no vitals filed for this visit.            Physical examination General NAD, Conversant  HEENT Atraumatic; Op clear with mmm.  Normo-cephalic. Pupils reactive. Anicteric sclerae  Thyroid/Neck Smooth without nodularity or enlargement. Normal ROM.  Neck Supple.  Skin No rashes, lesions or ulceration. Normal palpated skin turgor. No nodularity.  Breasts: No masses or discharge.  Symmetric.  No axillary adenopathy.  Lungs: Clear to auscultation.No rales or wheezes. Normal Respiratory effort, no retractions.  Heart: NSR.  No murmurs or rubs appreciated. No peripheral edema  Abdomen: Soft.  Non-tender.  No masses.  No HSM. No hernia  Extremities: Moves all appropriately.  Normal ROM for age. No lymphadenopathy.  Neuro: Oriented to PPT.  Normal mood. Normal affect.    Patient declined pelvic exam  Assessment:    G0P0000 Patient Active Problem List   Diagnosis Date Noted   Breast cancer screening 10/03/2018   Colon cancer screening 10/03/2018   Status post TAH-BSO 06/21/2017   History of endometriosis 06/21/2017   Skin lesion 06/21/2017   Herpes zoster 08/20/2016   Chronic pelvic pain in female 07/21/2014   Decreased libido 07/21/2014   Coitalgia 07/21/2014   History of migraine headaches 07/21/2014   Adult hypothyroidism 07/21/2014   Increased BMI 07/21/2014   Osteopenia 07/21/2014   Allergic rhinitis, seasonal 07/21/2014   Postsurgical menopause 07/21/2014   Dryness of vagina 07/21/2014   Health care maintenance 06/17/2014   Hypertension 09/18/2012   Endometriosis 09/18/2012     1. Well woman exam with routine gynecological exam   2. Postmenopausal hormone therapy     She would like to switch from a pill to a patch   Plan:            1.  Basic Screening Recommendations The basic screening recommendations for asymptomatic women were discussed with the patient during her visit.  The age-appropriate recommendations were discussed with her and  the rational for the tests reviewed.  When I am informed by the patient that another primary care physician has previously obtained the age-appropriate tests and they are up-to-date, only outstanding tests are ordered and referrals given as necessary.  Abnormal results of tests will be discussed with her when all of her results are completed.  Routine preventative health maintenance measures emphasized: Exercise/Diet/Weight control, Tobacco Warnings, Alcohol/Substance use risks and Stress Management Mammogram ordered 2.  Switch from pill to Climara patch at patient request 3.  Continue Prometrium at patient request Orders No orders of the defined types were placed in this encounter.    Meds ordered this encounter  Medications   progesterone (PROMETRIUM) 100 MG capsule    Sig: Take 1 capsule (100 mg total) by mouth daily.    Dispense:  90 capsule    Refill:  3   estradiol (CLIMARA - DOSED IN MG/24 HR) 0.05 mg/24hr patch    Sig: Place 1 patch (0.05 mg total) onto the skin once a week.    Dispense:  13 patch    Refill:  3          F/U  Return in about 1 year (around 05/20/2023) for Annual Physical.  Finis Bud, M.D. 05/19/2022 2:06 PM

## 2022-05-19 NOTE — Progress Notes (Signed)
Patients presents for annual exam today. She states doing well with HRT, would like to discuss the patch rather than pill. History of hysterectomy. Up to date with mammogram and colonoscopy. Annual labs are deferred to PCP. She states no other questions or concerns at this time.

## 2022-08-10 ENCOUNTER — Other Ambulatory Visit: Payer: Self-pay | Admitting: Podiatry

## 2022-12-25 IMAGING — CR DG CHEST 2V
2 series · 2 of 2 positions shown · non-contrast
Comparison: None.

CLINICAL DATA: Hypertension

EXAM:
CHEST - 2 VIEW

[chest pa]
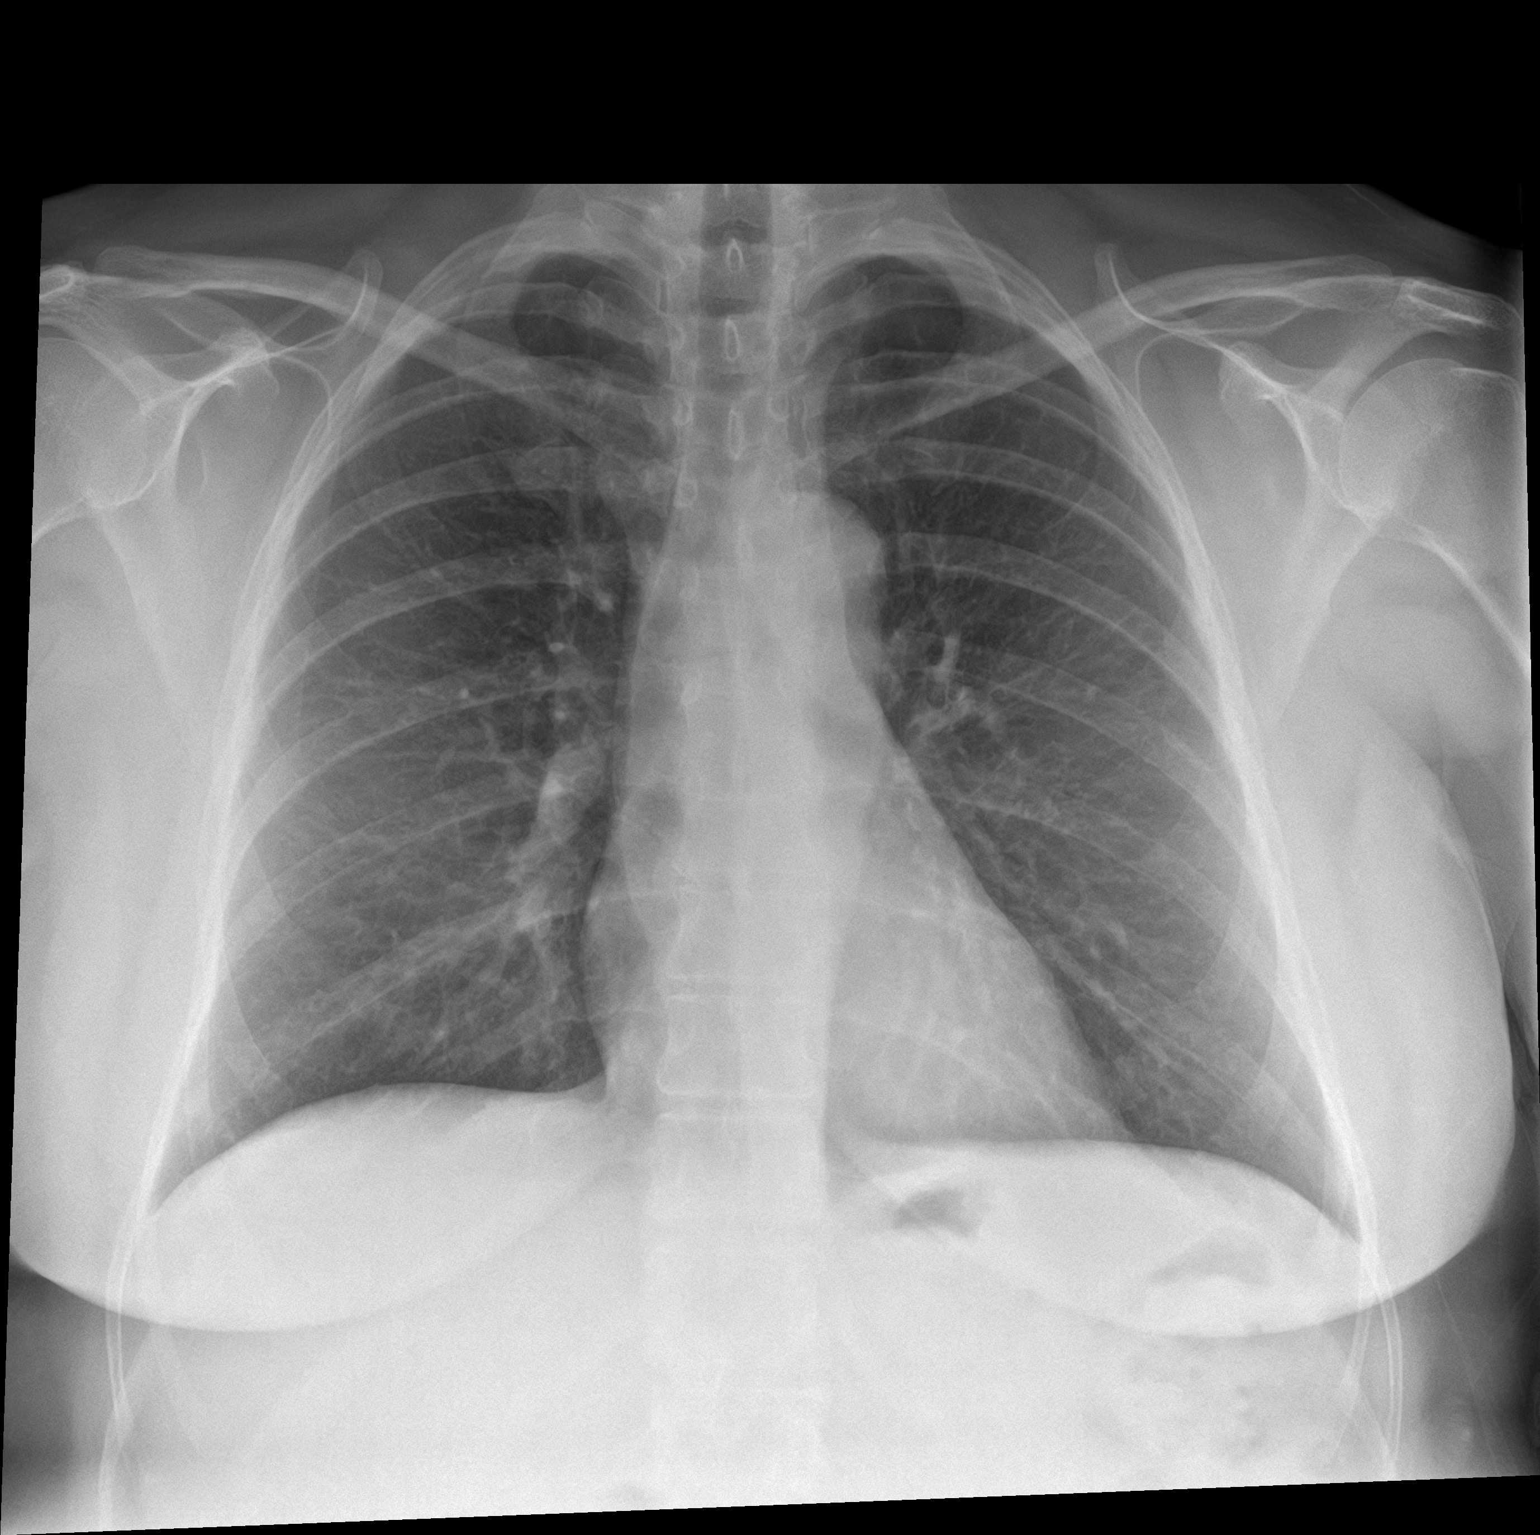

[chest lat]
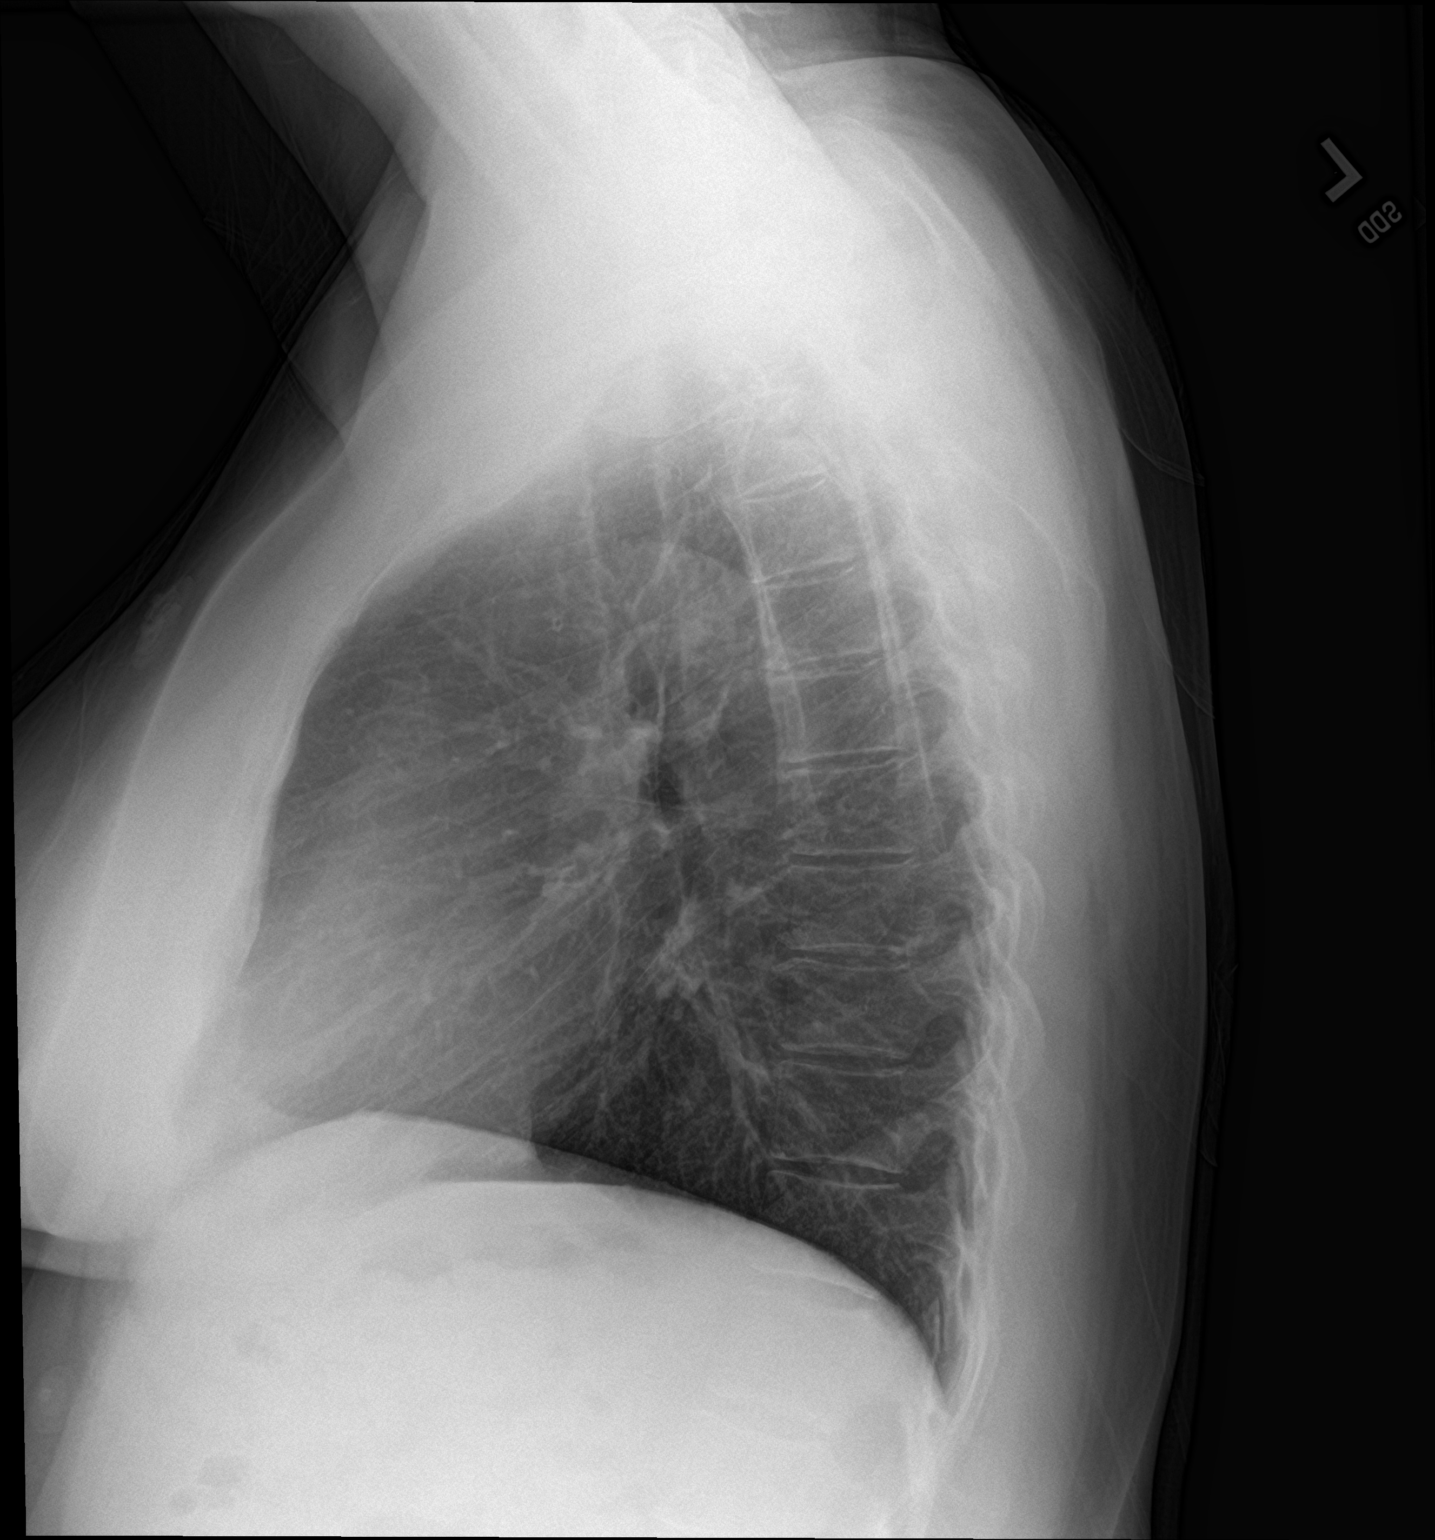

[2 of 2 positions shown; findings below may reference images not displayed]

FINDINGS: The heart size and mediastinal contours are within normal limits.
Both lungs are clear. The visualized skeletal structures are
unremarkable.
IMPRESSION: No active cardiopulmonary disease.

## 2023-05-02 ENCOUNTER — Other Ambulatory Visit: Payer: Self-pay | Admitting: Obstetrics and Gynecology

## 2023-05-02 DIAGNOSIS — Z79890 Hormone replacement therapy (postmenopausal): Secondary | ICD-10-CM

## 2023-07-28 ENCOUNTER — Other Ambulatory Visit: Payer: Self-pay | Admitting: Obstetrics and Gynecology

## 2023-07-28 DIAGNOSIS — Z79890 Hormone replacement therapy (postmenopausal): Secondary | ICD-10-CM

## 2023-08-04 ENCOUNTER — Encounter: Payer: Self-pay | Admitting: Obstetrics and Gynecology

## 2023-08-04 ENCOUNTER — Other Ambulatory Visit: Payer: Self-pay

## 2023-08-04 DIAGNOSIS — Z79890 Hormone replacement therapy (postmenopausal): Secondary | ICD-10-CM

## 2023-08-04 MED ORDER — PROGESTERONE MICRONIZED 100 MG PO CAPS: 100.0000 mg | ORAL_CAPSULE | Freq: Every day | ORAL | 0 refills | Status: DC

## 2023-08-06 ENCOUNTER — Other Ambulatory Visit: Payer: Self-pay | Admitting: Obstetrics and Gynecology

## 2023-08-06 DIAGNOSIS — Z79890 Hormone replacement therapy (postmenopausal): Secondary | ICD-10-CM

## 2023-08-18 ENCOUNTER — Ambulatory Visit (INDEPENDENT_AMBULATORY_CARE_PROVIDER_SITE_OTHER): Payer: Self-pay | Admitting: Obstetrics and Gynecology

## 2023-08-18 ENCOUNTER — Encounter: Payer: Self-pay | Admitting: Obstetrics and Gynecology

## 2023-08-18 VITALS — BP 137/89 | HR 79 | Ht 65.0 in | Wt 185.3 lb

## 2023-08-18 DIAGNOSIS — Z01419 Encounter for gynecological examination (general) (routine) without abnormal findings: Secondary | ICD-10-CM

## 2023-08-18 DIAGNOSIS — Z1211 Encounter for screening for malignant neoplasm of colon: Secondary | ICD-10-CM

## 2023-08-18 DIAGNOSIS — Z1231 Encounter for screening mammogram for malignant neoplasm of breast: Secondary | ICD-10-CM

## 2023-08-18 DIAGNOSIS — Z79890 Hormone replacement therapy (postmenopausal): Secondary | ICD-10-CM

## 2023-08-18 MED ORDER — ESTRADIOL 0.05 MG/24HR TD PTWK: 0.0500 mg | MEDICATED_PATCH | TRANSDERMAL | 3 refills | Status: AC

## 2023-08-18 MED ORDER — PROGESTERONE MICRONIZED 100 MG PO CAPS: 100.0000 mg | ORAL_CAPSULE | Freq: Every day | ORAL | 3 refills | Status: AC

## 2023-08-18 NOTE — Progress Notes (Signed)
 Patients presents for annual exam today. She states doing well with current HRT regimen of Climara  patch and progesterone . History of hysterectomy due to endometriosis. Due for mammogram, ordered. Deferred in office breast exam today. Patients annual labs are deferred to PCP.  She states no other questions or concerns at this time.

## 2023-08-18 NOTE — Progress Notes (Signed)
 HPI:      [name] is a 58 y.o. G0P0000 who LMP was No LMP recorded. Patient has had a hysterectomy.  Subjective:   She presents today for her annual examination.  She has no complaints.  She is using Climara  patch and is very happy with it.  She continues to take progesterone  daily despite having previously had a hysterectomy.  She says that the progesterone  makes her feel good and would like to continue it. She is up-to-date on her colonoscopy.    Hx: The following portions of the patient's history were reviewed and updated as appropriate:             She  has a past medical history of Endometriosis, migraines, Hypertension, Seasonal allergies, and Shoulder fracture. She does not have any pertinent problems on file. She  has a past surgical history that includes laparotomy (1994); laparoscopy (1999); Tonsillectomy (1997); Breast surgery (06/2004); Total abdominal hysterectomy w/ bilateral salpingoophorectomy (1999); and Augmentation mammaplasty. Her family history includes Alcohol abuse in her father; Arthritis in her maternal grandmother and mother; Heart disease in her maternal grandfather; Hypertension in her father and maternal grandfather; Stroke in her father. She  reports that she has never smoked. She has never used smokeless tobacco. She reports current alcohol use. She reports that she does not use drugs. She has a current medication list which includes the following prescription(s): cetirizine, cholecalciferol, coenzyme q10, losartan, meloxicam , multi-vitamin, pantoprazole, progesterone , amlodipine , estradiol , and progesterone . She is allergic to bee pollen, codeine sulfate, imitrex [sumatriptan], and percocet [oxycodone-acetaminophen].       Review of Systems:  Review of Systems  Constitutional: Denied constitutional symptoms, night sweats, recent illness, fatigue, fever, insomnia and weight loss.  Eyes: Denied eye symptoms, eye pain, photophobia, vision change and  visual disturbance.  Ears/Nose/Throat/Neck: Denied ear, nose, throat or neck symptoms, hearing loss, nasal discharge, sinus congestion and sore throat.  Cardiovascular: Denied cardiovascular symptoms, arrhythmia, chest pain/pressure, edema, exercise intolerance, orthopnea and palpitations.  Respiratory: Denied pulmonary symptoms, asthma, pleuritic pain, productive sputum, cough, dyspnea and wheezing.  Gastrointestinal: Denied, gastro-esophageal reflux, melena, nausea and vomiting.  Genitourinary: Denied genitourinary symptoms including symptomatic vaginal discharge, pelvic relaxation issues, and urinary complaints.  Musculoskeletal: Denied musculoskeletal symptoms, stiffness, swelling, muscle weakness and myalgia.  Dermatologic: Denied dermatology symptoms, rash and scar.  Neurologic: Denied neurology symptoms, dizziness, headache, neck pain and syncope.  Psychiatric: Denied psychiatric symptoms, anxiety and depression.  Endocrine: Denied endocrine symptoms including hot flashes and night sweats.   Meds:   Current Outpatient Medications on File Prior to Visit  Medication Sig Dispense Refill   cetirizine (ZYRTEC) 10 MG tablet Take 10 mg by mouth daily.     Cholecalciferol 25 MCG (1000 UT) tablet Take by mouth.     Coenzyme Q10 10 MG capsule Take by mouth.     losartan (COZAAR) 50 MG tablet Take 50 mg by mouth in the morning and at bedtime.     meloxicam  (MOBIC ) 15 MG tablet TAKE 1 TABLET BY MOUTH DAILY 90 tablet 3   Multiple Vitamin (MULTI-VITAMIN) tablet Take 1 tablet by mouth daily.     pantoprazole (PROTONIX) 20 MG tablet Take 40 mg by mouth daily.     progesterone  (PROMETRIUM ) 100 MG capsule TAKE ONE CAPSULE BY MOUTH EVERY NIGHT AT BEDTIME 90 capsule 0   amLODipine  (NORVASC ) 10 MG tablet TAKE ONE TABLET BY MOUTH DAILY (Patient not taking: Reported on 08/18/2023) 90 tablet 2   No current facility-administered medications on  file prior to visit.     Objective:     Vitals:   08/18/23  1450  BP: 137/89  Pulse: 79    Filed Weights   08/18/23 1450  Weight: 185 lb 4.8 oz (84.1 kg)              She has declined examination today.  She reports she has no breast or pelvic complaints and regularly does her own breast examinations.  Assessment:    G0P0000 Patient Active Problem List   Diagnosis Date Noted   Breast cancer screening 10/03/2018   Colon cancer screening 10/03/2018   Status post TAH-BSO 06/21/2017   History of endometriosis 06/21/2017   Skin lesion 06/21/2017   Herpes zoster 08/20/2016   Chronic pelvic pain in female 07/21/2014   Decreased libido 07/21/2014   Coitalgia 07/21/2014   History of migraine headaches 07/21/2014   Adult hypothyroidism 07/21/2014   Increased BMI 07/21/2014   Osteopenia 07/21/2014   Allergic rhinitis, seasonal 07/21/2014   Postsurgical menopause 07/21/2014   Dryness of vagina 07/21/2014   Health care maintenance 06/17/2014   Hypertension 09/18/2012   Endometriosis 09/18/2012     1. Well woman exam with routine gynecological exam   2. Screening mammogram for breast cancer   3. Postmenopausal hormone therapy     Very happy with her HRT.  She is using Prometrium  simply because she thinks it does not good things for her.   Plan:            1.  Basic Screening Recommendations The basic screening recommendations for asymptomatic women were discussed with the patient during her visit.  The age-appropriate recommendations were discussed with her and the rational for the tests reviewed.  When I am informed by the patient that another primary care physician has previously obtained the age-appropriate tests and they are up-to-date, only outstanding tests are ordered and referrals given as necessary.  Abnormal results of tests will be discussed with her when all of her results are completed.  Routine preventative health maintenance measures emphasized: Exercise/Diet/Weight control, Tobacco Warnings, Alcohol/Substance use risks and  Stress Management Recommend breast examination at annuals in the future. Mammography ordered 2.  Continue HRT -discussed the possibility of stopping Prometrium  and she would like to continue it. Orders Orders Placed This Encounter  Procedures   MM DIGITAL SCREENING BILATERAL     Meds ordered this encounter  Medications   estradiol  (CLIMARA  - DOSED IN MG/24 HR) 0.05 mg/24hr patch    Sig: Place 1 patch (0.05 mg total) onto the skin once a week.    Dispense:  12 patch    Refill:  3   progesterone  (PROMETRIUM ) 100 MG capsule    Sig: Take 1 capsule (100 mg total) by mouth daily.    Dispense:  90 capsule    Refill:  3            F/U  Return in about 1 year (around 08/17/2024) for Annual Physical.  Delice Felt, M.D. 08/18/2023 3:20 PM
